# Patient Record
Sex: Male | Born: 1980
Health system: Southern US, Community
[De-identification: ages and names within clinical notes are randomized; demographics above are authoritative.]

## PROBLEM LIST (undated history)

## (undated) DIAGNOSIS — E669 Obesity, unspecified: Secondary | ICD-10-CM

## (undated) DIAGNOSIS — Z8619 Personal history of other infectious and parasitic diseases: Secondary | ICD-10-CM

## (undated) DIAGNOSIS — Z8701 Personal history of pneumonia (recurrent): Secondary | ICD-10-CM

## (undated) DIAGNOSIS — T7840XA Allergy, unspecified, initial encounter: Secondary | ICD-10-CM

## (undated) DIAGNOSIS — F419 Anxiety disorder, unspecified: Secondary | ICD-10-CM

## (undated) DIAGNOSIS — F902 Attention-deficit hyperactivity disorder, combined type: Secondary | ICD-10-CM

## (undated) DIAGNOSIS — E785 Hyperlipidemia, unspecified: Secondary | ICD-10-CM

## (undated) HISTORY — DX: Allergy, unspecified, initial encounter: T78.40XA

## (undated) HISTORY — DX: Personal history of other infectious and parasitic diseases: Z86.19

## (undated) HISTORY — PX: SPINE SURGERY: SHX786

## (undated) HISTORY — DX: Hyperlipidemia, unspecified: E78.5

## (undated) HISTORY — DX: Attention-deficit hyperactivity disorder, combined type: F90.2

## (undated) HISTORY — DX: Obesity, unspecified: E66.9

## (undated) HISTORY — DX: Personal history of pneumonia (recurrent): Z87.01

## (undated) HISTORY — DX: Anxiety disorder, unspecified: F41.9

---
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
## 2004-02-02 ENCOUNTER — Ambulatory Visit: Payer: Self-pay | Admitting: Family Medicine

## 2004-06-07 ENCOUNTER — Ambulatory Visit: Payer: Self-pay | Admitting: Internal Medicine

## 2004-11-10 ENCOUNTER — Ambulatory Visit: Payer: Self-pay | Admitting: Internal Medicine

## 2004-11-11 ENCOUNTER — Encounter: Payer: Self-pay | Admitting: Family Medicine

## 2004-11-11 LAB — CONVERTED CEMR LAB
ALT: 29 U/L
AST: 20 U/L
Albumin: 4.4 g/dL
Alkaline Phosphatase: 95 U/L
BUN: 9 mg/dL
CO2: 29 meq/L
Calcium: 9.3 mg/dL
Chloride: 100 meq/L
Cholesterol: 165 mg/dL
Creatinine, Ser: 1.1 mg/dL
Glucose, Bld: 86 mg/dL
HCT: 45.7 %
HDL: 26.3 mg/dL
Hemoglobin: 15 g/dL
LDL Cholesterol: 121 mg/dL
MCV: 84.1 fL
Platelets: 315 K/uL
Potassium: 3.7 meq/L
RBC: 5.43 M/uL
Sodium: 138 meq/L
TSH: 3.33 u[IU]/mL
Total Bilirubin: 1.6 mg/dL
Total Protein: 7.4 g/dL
Triglycerides: 88 mg/dL
WBC: 9.5 10*3/microliter

## 2006-01-23 ENCOUNTER — Ambulatory Visit: Payer: Self-pay | Admitting: Family Medicine

## 2007-05-29 ENCOUNTER — Ambulatory Visit: Payer: Self-pay | Admitting: Internal Medicine

## 2007-05-30 ENCOUNTER — Telehealth (INDEPENDENT_AMBULATORY_CARE_PROVIDER_SITE_OTHER): Payer: Self-pay | Admitting: *Deleted

## 2009-01-07 ENCOUNTER — Ambulatory Visit: Payer: Self-pay | Admitting: Family Medicine

## 2009-01-07 ENCOUNTER — Encounter (INDEPENDENT_AMBULATORY_CARE_PROVIDER_SITE_OTHER): Payer: Self-pay | Admitting: Internal Medicine

## 2009-11-26 ENCOUNTER — Encounter: Payer: Self-pay | Admitting: Family Medicine

## 2009-11-26 LAB — CONVERTED CEMR LAB
Glucose, Bld: 84 mg/dL
HDL: 17 mg/dL
Triglycerides: 148 mg/dL

## 2010-03-16 ENCOUNTER — Encounter: Payer: Self-pay | Admitting: Family Medicine

## 2010-03-17 ENCOUNTER — Encounter: Payer: Self-pay | Admitting: Family Medicine

## 2010-03-17 ENCOUNTER — Ambulatory Visit
Admission: RE | Admit: 2010-03-17 | Discharge: 2010-03-17 | Payer: Self-pay | Source: Home / Self Care | Attending: Family Medicine | Admitting: Family Medicine

## 2010-03-17 DIAGNOSIS — R002 Palpitations: Secondary | ICD-10-CM | POA: Insufficient documentation

## 2010-03-17 DIAGNOSIS — E66811 Obesity, class 1: Secondary | ICD-10-CM | POA: Insufficient documentation

## 2010-03-17 DIAGNOSIS — E785 Hyperlipidemia, unspecified: Secondary | ICD-10-CM | POA: Insufficient documentation

## 2010-03-17 DIAGNOSIS — E669 Obesity, unspecified: Secondary | ICD-10-CM | POA: Insufficient documentation

## 2010-04-08 NOTE — Assessment & Plan Note (Signed)
Summary: Gerald [name] to get established with Dr. Reece [name].   Vital Signs:  [name] profile:   30 year old male Height:      69 inches Weight:      239.25 pounds BMI:     35.46 Temp:     98.2 degrees F oral Pulse rate:   88 / minute Pulse rhythm:   regular BP sitting:   118 / 70  (left arm) Cuff size:   large  Vitals Entered By: Gerald [name] CMA (AAMA) (March 17, 2010 10:30 AM) CC: Re-establish care   History of Present Illness: CC: re establish  previously seen here, saw Gerald bean and other nurse practitioner  12/2009 - biometric screening told cholesterol not the best.  Thinks TC was under 200.  good cholesterol was very low.  was fasting when checked.    feeling "off" last few days.  feels heart rate racing last few days.  Monday had bad headache with tunnel vision, went away with alleve and water and meal.  No chest pain/tightness with palpitations.  + mild SOB and diaphoresis with this.  No dizziness.  No n/v.  Stress level ok currently.  No skipped beats.  clarified as "heart beating louder than normal"  obesity - BMI 35.  current weight is heaviest he has been.  fast food 3x/wk.  poor vegetable intake, 2-3 fruits a day.  also having time constraints.  exercise - plays with kids.  uses stairs instead of elevator pretty consistently.  drinking coke, 2/day.  preventative flu shot - declines tetanus - unsure blood work at work.  will bring in for [place] to have copy.  Current Medications (verified): 1)  Claritin 10 Mg Tabs (Loratadine) .... As Needed in Fall 2)  Fish Oil 1000 Mg Caps (Omega-3 Fatty Acids) .... Sporadic Daily  Allergies: 1)  ! * Pencillin 2)  ! 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  The [name] denies anorexia, fever, weight loss, weight gain, vision loss, decreased hearing, hoarseness, chest pain, syncope, dyspnea on exertion, peripheral edema, prolonged cough, hemoptysis, abdominal pain, melena, hematochezia, severe indigestion/heartburn, hematuria, depression, and testicular masses.    Physical Exam  General:  Well-developed,well-nourished,in no acute distress; alert,appropriate and cooperative throughout examination Head:  Normocephalic and atraumatic without obvious abnormalities.  Eyes:  No corneal or conjunctival inflammation noted. EOMI. Perrla. Ears:  TMs clear bilaterally Nose:  nares clear Mouth:  MMM, no pharyngeal erythema/edema Neck:  supple and full ROM.  no LAD, no thyromegaly Lungs:  normal respiratory effort, no accessory muscle use, and normal breath sounds.   Heart:  normal rate and regular rhythm.   Abdomen:  Bowel sounds positive,abdomen soft and non-tender without masses, organomegaly or hernias noted. Msk:  No deformity or scoliosis noted of thoracic or lumbar spine.   Pulses:  2+ rad pulses Extremities:  no pedal edema Neurologic:  CN grossly intact, station and gait intact Skin:  Intact without suspicious lesions or rashes Psych:  normally interactive and good eye contact.  full affect   Impression & Recommendations:  Problem # 1:  PALPITATIONS (ICD-785.1) no red flags.  advised titrate down on caffeine.  Stress reduction discussed.  bring copy of blood work from work, will likely want to check TSH and CBC once review blood work.  return if worsening, o/w return 3 mo for f/u.  EKG - NSR @ 70, no acute ST/T changes, normal intervals, axis.  Problem # 2:  DYSLIPIDEMIA (ICD-272.4)  per [name].  told had  low HDL this year.  discussed increased aerobic exercise as well as weight loss  Labs Reviewed: SGOT: 20 (11/11/2004)   SGPT: 29 (11/11/2004)   HDL:26.3 (11/11/2004)  LDL:121 (11/11/2004)  Chol:165 (11/11/2004)  Trig:88 (11/11/2004)  Problem # 3:  OBESITY (ICD-278.00) await blood work, review, update if any extra blood work desired.  return 3 mo for f/u. Ht: 69 (03/17/2010)   Wt: 239.25 (03/17/2010)   BMI: 35.46 (03/17/2010)  Complete Medication List: 1)  Claritin 10 Mg Tabs (Loratadine) .... As needed in fall 2)  Fish Oil 1000 Mg Caps (Omega-3 fatty acids) .... Sporadic daily  Other Orders: Tdap => 34yrs IM (16109) Admin 1st Vaccine (60454)  [name] Instructions: 1)  Bring copy of recent blood work for [place] to have.  I will review and then contact you with further blood work. 2)  Return in 3 months for f/u 3)  Checked EKG today for heart racing - looking normal. 4)  Slowly back away from caffeine. 5)  Keep eye on diet - more fruits and vegetables. 6)  aerobic exercises is best thing to raise good cholesterol (HDL) 7)  Good to meet you today, call clinic with quesitons.   Orders Added: 1)  Tdap => 56yrs IM [90715] 2)  Admin 1st Vaccine [90471]   Immunizations Administered:  Tetanus Vaccine:    Vaccine Type: Tdap    Site: left deltoid    Mfr: GlaxoSmithKline    Dose: 0.5 ml    Route: IM    Given by: Gerald [name] CMA (AAMA)    Exp. Date: 12/25/2011    Lot #: [id number]    VIS given: 01/23/08 version given March 17, 2010.   Immunizations Administered:  Tetanus Vaccine:    Vaccine Type: Tdap    Site: left deltoid    Mfr: GlaxoSmithKline    Dose: 0.5 ml    Route: IM    Given by: Gerald [name] CMA (AAMA)    Exp. Date: 12/25/2011    Lot #: [id number]    VIS given: 01/23/08 version given March 17, 2010.  Prior Medications: Current Allergies (reviewed today): ! * PENCILLIN ! * SHELL FISH   Prevention & Chronic Care Immunizations   Influenza vaccine: Not  documented    Tetanus booster: 03/17/2010: Tdap   Tetanus booster due: 03/17/2020    Pneumococcal vaccine: Not documented  Other Screening   Smoking status: Not documented  Lipids   Total Cholesterol: 165  (11/11/2004)   LDL: 121  (11/11/2004)   LDL Direct: Not documented   HDL: 26.3  (11/11/2004)   Triglycerides: 88  (11/11/2004)    SGOT (AST): 20  (11/11/2004)   SGPT (ALT): 29  (11/11/2004)   Alkaline phosphatase: 95  (11/11/2004)   Total bilirubin: 1.6  (11/11/2004)  Self-Management Support :    Lipid self-management support: Not documented     Appended Document: Orders Update    Clinical Lists Changes  Orders: Added new Service order of Est. [name] Level  IV (16109) - Signed      Appended Document: Gerald [name] to get established with Dr. Reece [name].    Clinical Lists Changes  Orders: Added new Service order of EKG w/ Interpretation (93000) - Signed

## 2010-04-08 NOTE — Miscellaneous (Signed)
Summary: Paper chart update/input  Clinical Lists Changes     Allergies: Changed allergy or adverse reaction from * PENCILLIN to * PENCILLIN  Protein S Panel: Protein S-Functional: 105 (11/11/2004) Protein S, Total: 148 (11/11/2004) Protein S, Free: 115 (11/11/2005) Observations: Added new observation of PAST SURG HX: Adenoidectomy 1984 (03/16/2010 16:03) Added new observation of PAST MED HX: pneumonia 1988 h/o chicken pox (03/16/2010 16:03) Added new observation of ALLERGY REV: Done (03/16/2010 16:03) Added new observation of FAMILY HX: sister - CAD/MI age 32yo, Protein S deficiency, T2DM, asthma (03/16/2010 16:03) Added new observation of PLATELETK/UL: 315 K/uL (11/11/2004 16:03) Added new observation of MCV: 84.1 fL (11/11/2004 16:03) Added new observation of HCT: 45.7 % (11/11/2004 16:03) Added new observation of HGB: 15.0 g/dL (14/78/2956 21:30) Added new observation of RBC M/UL: 5.43 M/uL (11/11/2004 16:03) Added new observation of WBC COUNT: 9.5 10*3/microliter (11/11/2004 16:03) Added new observation of TSH: 3.33 microintl units/mL (11/11/2004 16:03) Added new observation of CALCIUM: 9.3 mg/dL (86/57/8469 62:95) Added new observation of ALBUMIN: 4.4 g/dL (28/41/3244 01:02) Added new observation of PROTEIN, TOT: 7.4 g/dL (72/53/6644 03:47) Added new observation of SGPT (ALT): 29 units/L (11/11/2004 16:03) Added new observation of SGOT (AST): 20 units/L (11/11/2004 16:03) Added new observation of ALK PHOS: 95 units/L (11/11/2004 16:03) Added new observation of BILI TOTAL: 1.6 mg/dL (42/59/5638 75:64) Added new observation of CREATININE: 1.1 mg/dL (33/29/5188 41:66) Added new observation of BUN: 9 mg/dL (09/04/1599 09:32) Added new observation of BG RANDOM: 86 mg/dL (35/57/3220 25:42) Added new observation of CO2 PLSM/SER: 29 meq/L (11/11/2004 16:03) Added new observation of CL SERUM: 100 meq/L (11/11/2004 16:03) Added new observation of K SERUM: 3.7 meq/L (11/11/2004  16:03) Added new observation of NA: 138 meq/L (11/11/2004 16:03) Added new observation of LDL: 121 mg/dL (70/62/3762 83:15) Added new observation of HDL: 26.3 mg/dL (17/61/6073 71:06) Added new observation of TRIGLYC TOT: 88 mg/dL (26/94/8546 27:03) Added new observation of CHOLESTEROL: 165 mg/dL (50/11/3816 29:93) Added new observation of TD BOOSTER: given  (08/19/1993 18:30)       Family History: sister - CAD/MI age 5yo, Protein S deficiency, T2DM, asthma   -  Date:  08/19/1993    TD booster given   Past History:  Past Medical History: pneumonia 23 h/o chicken pox  Past Surgical History: Adenoidectomy 1984   Allergies: 1)  ! * Pencillin 2)  ! * Shell Fish

## 2010-05-08 ENCOUNTER — Encounter: Payer: Self-pay | Admitting: Family Medicine

## 2010-05-12 ENCOUNTER — Encounter: Payer: Self-pay | Admitting: Family Medicine

## 2010-05-24 ENCOUNTER — Ambulatory Visit (INDEPENDENT_AMBULATORY_CARE_PROVIDER_SITE_OTHER): Payer: 59 | Admitting: Internal Medicine

## 2010-05-24 ENCOUNTER — Encounter: Payer: Self-pay | Admitting: Internal Medicine

## 2010-05-24 DIAGNOSIS — J039 Acute tonsillitis, unspecified: Secondary | ICD-10-CM

## 2010-05-24 DIAGNOSIS — J309 Allergic rhinitis, unspecified: Secondary | ICD-10-CM | POA: Insufficient documentation

## 2010-05-25 NOTE — Miscellaneous (Signed)
Summary: health screening results  Clinical Lists Changes  please input into flowsheet.   call pt for rpt blood work fasting - [CBC, TSH, BMP, FLP 278.0, 272.4, 785.1] notify I reviewed blood work - HDL (good cholesterol) too low at 17.  LDL (bad cholesterol) some high at 121, goal less than 100 for him given family history.  do want to check some other blood work (to check on palpitations) Gerald Boyden  MD  May 12, 2010 10:35 PM  Chart updated. Message left for patient to return my call. Kim Dance CMA Duncan Dull)  May 13, 2010 11:43 AM   Spoke with patient's wife. She will have patient call to schedule labs. Kim Dance CMA Duncan Dull)  May 17, 2010 1:08 PM   Spoke with patient. He requests a written script for the labs and he will pick it up and have labs drawn at work [employer]) with results faxed to [place]. Kim Dance CMA Duncan Dull)  May 17, 2010 1:54 PM   done. Gerald Boyden  MD  May 17, 2010 3:31 PM   Order placed up front for patient to pick up. Kim Dance CMA (AAMA)  May 18, 2010 1:15 PM  Observations: Added new observation of BG RANDOM: 84 mg/dL (16/12/9602 54:09) Added new observation of LDL: 121 mg/dL (81/19/1478 29:56) Added new observation of HDL: 17 mg/dL (21/30/8657 84:69) Added new observation of TRIGLYC TOT: 148 mg/dL (62/95/2841 32:44) Added new observation of CHOLESTEROL: 168 mg/dL (03/09/7251 66:44)

## 2010-06-03 NOTE — Letter (Signed)
Summary: history form   history form   Imported By: Eugenio Hoes 05/25/2010 14:59:42  _____________________________________________________________________  External Attachment:    Type:   Image     Comment:   External Document

## 2010-06-03 NOTE — Assessment & Plan Note (Signed)
Summary: left ear hurt and throat hurts   Vital Signs:  Patient Profile:   30 Years Old Male CC:      possible ear infection and sore throat x 4 days Height:     69 inches Weight:      234 pounds Temp:     99.0 degrees F Pulse rate:   88 / minute Resp:     14 per minute BP sitting:   108 / 68  (left arm)  Pt. in pain?   no                   Prior Medication List:  CLARITIN 10 MG TABS (LORATADINE) as needed in fall FISH OIL 1000 MG CAPS (OMEGA-3 FATTY ACIDS) sporadic daily   Current Allergies: ! * PENCILLIN ! * SHELL FISHHistory of Present Illness Chief Complaint: possible ear infection and sore throat x 4 days History of Present Illness: family has been sick and required antibiotics. Patient with sudden onset sore throat. Nasal congestion worse than usual allergies. Left ear started hurting yest. Had chill yest with temp 100.5 and gen'l aches.  REVIEW OF SYSTEMS Constitutional Symptoms       Complains of chills.     Denies fever, night sweats, weight loss, weight gain, and fatigue.  Eyes       Denies change in vision, eye pain, eye discharge, glasses, contact lenses, and eye surgery. Ear/Nose/Throat/Mouth       Complains of ear pain, sinus problems, and sore throat.      Denies hearing loss/aids, change in hearing, ear discharge, dizziness, frequent runny nose, frequent nose bleeds, hoarseness, and tooth pain or bleeding.  Respiratory       Complains of dry cough.      Denies productive cough, wheezing, shortness of breath, asthma, bronchitis, and emphysema/COPD.  Cardiovascular       Denies murmurs, chest pain, and tires easily with exhertion.    Gastrointestinal       Denies stomach pain, nausea/vomiting, diarrhea, constipation, blood in bowel movements, and indigestion. Genitourniary       Denies painful urination, kidney stones, and loss of urinary control. Neurological       Denies paralysis, seizures, and fainting/blackouts. Musculoskeletal       Denies muscle  pain, joint pain, joint stiffness, decreased range of motion, redness, swelling, muscle weakness, and gout.  Skin       Denies bruising, unusual mles/lumps or sores, and hair/skin or nail changes.  Psych       Denies mood changes, temper/anger issues, anxiety/stress, speech problems, depression, and sleep problems.  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 Lives with wife, 2 sons, 1 cat  Past Medical History: pneumonia 1988 obesity h/o chicken pox Allergic rhinitis Physical Exam General appearance: well developed, well nourished, no acute distress Head: normocephalic, atraumatic Eyes: conjunctivae and lids normal Pupils: equal, round, reactive to light Ears: dull, non-mobile TML Nasal: pale and boggy nasal mucosa Oral/Pharynx: bilateral tonsilar enlargement with whited spotty exudates, uvula midline without deviation Neck: supple,anterior lymphadenopathy present Chest/Lungs: no rales, wheezes, or rhonchi  bilateral, breath sounds equal without effort Extremities: normal extremities Neurological: grossly intact and non-focal Skin: no obvious rashe MSE: oriented to time, place, and person Assessment New Problems: ALLERGIC RHINITIS (ICD-477.9) ACUTE TONSILLITIS (ICD-463)   Plan New Medications/Changes: AZITHROMYCIN 250 MG TABS (AZITHROMYCIN) 2 by mouth then 1 by mouth qd  #6 x 0, 05/24/2010, J. Juline Patch MD   The patient and/or caregiver has been counseled thoroughly with regard to medications prescribed including  dosage, schedule, interactions, rationale for use, and possible side effects and they verbalize understanding.  Diagnoses and expected course of recovery discussed and will return if not improved as expected or if the condition worsens. Patient and/or caregiver verbalized understanding.  Prescriptions: AZITHROMYCIN 250 MG TABS (AZITHROMYCIN) 2 by mouth then 1 by mouth qd  #6 x 0   Entered and Authorized by:   J. Juline Patch MD   Signed by:   Shela Commons. Juline Patch MD on 05/24/2010   Method used:   Electronically to        [facility]  [address] [id number] (retail)       [address]       [place], [place]  [address]       Ph: [phone] or [phone]       Fax: [phone]   RxID:   [id number]   Patient Instructions: 1)  Take 650-1000mg  of Tylenol every 4-6 hours as needed for relief of pain or comfort of fever AVOID taking more than 4000mg   in a 24 hour period (can cause liver damage in higher doses). 2)  Take 400-600mg  of Ibuprofen (Advil, Motrin) with food every 4-6 hours as needed for relief of pain or comfort of fever. 3)  gargles, lozenges 4)  claritin-d every 12 hrs. 5)  afrin generic 2 sprays in each nostril every 12 hours for 3 days maximum followed by 3 days of non-use. Continue cycle as needed to control nasal or ear congestion

## 2010-06-22 ENCOUNTER — Ambulatory Visit: Payer: Self-pay | Admitting: Family Medicine

## 2010-07-07 ENCOUNTER — Ambulatory Visit (INDEPENDENT_AMBULATORY_CARE_PROVIDER_SITE_OTHER): Payer: 59 | Admitting: Family Medicine

## 2010-07-07 ENCOUNTER — Encounter: Payer: Self-pay | Admitting: Family Medicine

## 2010-07-07 DIAGNOSIS — E669 Obesity, unspecified: Secondary | ICD-10-CM

## 2010-07-07 DIAGNOSIS — E785 Hyperlipidemia, unspecified: Secondary | ICD-10-CM

## 2010-07-07 LAB — LIPID PANEL
Cholesterol, Total: 158
Triglycerides: 149

## 2010-07-07 LAB — COMPREHENSIVE METABOLIC PANEL
ALT: 41 U/L — AB (ref 10–40)
AST: 21 U/L
Albumin: 4.7
Glucose: 88
Total Bilirubin: 1 mg/dL

## 2010-07-07 NOTE — Assessment & Plan Note (Signed)
Discussed healthy eating, encouraged continued exercise.  Down 6 lbs since last visit.

## 2010-07-07 NOTE — Patient Instructions (Signed)
blood work today to check on sugar as well as cholesterol levels. Good to see you today, call us with questions. Return in 6 months for follow up.

## 2010-07-07 NOTE — Assessment & Plan Note (Addendum)
Sister with h/o CAD/MI age 32yo (in h/o prot S def and PE).  Goal LDL discussed <100.   Pt prefers to try lifestyle changes. Forgot to provide with low chol diet but will ask Selena Batten to mail to him. Check blood work today.

## 2010-07-07 NOTE — Progress Notes (Signed)
  Subjective:    Patient ID: Gerald [name], male    DOB: [birth year], 30 y.o.   MRN: [id number]  HPI CC: f/u blood work and labs  Weight down 6 lbs!  Dietwise - better, less fast food, more fish.  Participating in "biggest loser" program at work, working and playing more outside.  Golf league restarted.  No recent blood work, has been fasting.  Gets blood at [facility].  No problems with palpitations recently.  No myalgias.  Medications and allergies reviewed and updated as above. PMHx reviewed and updated as well as FmHx.  Review of Systems Per HPI    Objective:   Physical Exam  Vitals reviewed. Constitutional: He appears well-developed and well-nourished. No distress.  HENT:  Head: Normocephalic and atraumatic.  Mouth/Throat: Oropharynx is clear and moist. No oropharyngeal exudate.  Eyes: Conjunctivae are normal. Pupils are equal, round, and reactive to light. No scleral icterus.  Neck: Normal range of motion. Neck supple. Carotid bruit is not present.  Cardiovascular: Normal rate, regular rhythm, normal heart sounds and intact distal pulses.   No murmur heard. Pulmonary/Chest: Effort normal and breath sounds normal. No respiratory distress. He has no wheezes. He has no rales.  Abdominal: Soft. Bowel sounds are normal.  Lymphadenopathy:    He has no cervical adenopathy.  Skin: Skin is warm. No rash noted.  Psychiatric: He has a normal mood and affect.          Assessment & Plan:

## 2010-07-08 ENCOUNTER — Encounter: Payer: Self-pay | Admitting: Family Medicine

## 2011-02-15 ENCOUNTER — Ambulatory Visit (INDEPENDENT_AMBULATORY_CARE_PROVIDER_SITE_OTHER): Payer: 59 | Admitting: Family Medicine

## 2011-02-15 ENCOUNTER — Encounter: Payer: Self-pay | Admitting: Family Medicine

## 2011-02-15 VITALS — BP 114/68 | HR 92 | Temp 98.7°F | Wt 225.5 lb

## 2011-02-15 DIAGNOSIS — J40 Bronchitis, not specified as acute or chronic: Secondary | ICD-10-CM

## 2011-02-15 MED ORDER — HYDROCOD POLST-CHLORPHEN POLST 10-8 MG/5ML PO LQCR: 5.0000 mL | Freq: Every evening | ORAL | Status: DC | PRN

## 2011-02-15 NOTE — Progress Notes (Signed)
  Subjective:    Patient ID: Gerald [name], male    DOB: [birth year], 30 y.o.   MRN: [id number]  HPI CC: cough, congestion  4d h/o cough, congestion.  Seems to be getting progressively worse.  This morning awoke with hoarse voice.  + significant sinus drainage, chest congestion.  Mild sinus pressure, no HA.  Sleeping ok.  Tried mucinex fast max but hasn't helped.    No fevers/chills, abd pain, n/v/d, myalgia, arthralgia, body aches.  No chest discomfort.  Mild SOB.  + sick contacts at home.  No smokers at home.  No h/o asthma.  Allergies controlled by claritin.  Review of Systems Per HPI    Objective:   Physical Exam  Nursing note and vitals reviewed. Constitutional: He appears well-developed and well-nourished. No distress.  HENT:  Head: Normocephalic and atraumatic.  Right Ear: Hearing, tympanic membrane, external ear and ear canal normal.  Left Ear: Hearing, tympanic membrane, external ear and ear canal normal.  Nose: Nose normal. No mucosal edema or rhinorrhea. Right sinus exhibits no maxillary sinus tenderness and no frontal sinus tenderness. Left sinus exhibits no maxillary sinus tenderness and no frontal sinus tenderness.  Mouth/Throat: Uvula is midline, oropharynx is clear and moist and mucous membranes are normal. No oropharyngeal exudate, posterior oropharyngeal edema, posterior oropharyngeal erythema or tonsillar abscesses.  Eyes: Conjunctivae and EOM are normal. Pupils are equal, round, and reactive to light. No scleral icterus.  Neck: Normal range of motion. Neck supple.  Cardiovascular: Normal rate, regular rhythm, normal heart sounds and intact distal pulses.   No murmur heard. Pulmonary/Chest: Effort normal and breath sounds normal. No respiratory distress. He has no wheezes. He has no rales.  Lymphadenopathy:    He has no cervical adenopathy.  Skin: Skin is warm and dry. No rash noted.       Assessment & Plan:

## 2011-02-15 NOTE — Assessment & Plan Note (Signed)
Anticipate viral.  Supportive care. No need for abx currently. Update [place] if not improving as expected.

## 2011-02-15 NOTE — Patient Instructions (Signed)
Sounds like you have a viral upper respiratory infection. Antibiotics are not needed for this.  Viral infections usually take 7-10 days to resolve.  The cough can last several weeks to go away. Use medication as prescribed: tussionex for cough at night time. Push fluids and plenty of rest. May continue mucinex with plenty of fluid to mobilize mucous. Please let us know if you are not improving as expected, or if you have high fevers (>101.5) or difficulty swallowing or worsening productive cough. Call clinic with questions.  Good to see you today.

## 2011-04-13 ENCOUNTER — Ambulatory Visit (INDEPENDENT_AMBULATORY_CARE_PROVIDER_SITE_OTHER): Payer: 59 | Admitting: Family Medicine

## 2011-04-13 ENCOUNTER — Encounter: Payer: Self-pay | Admitting: Family Medicine

## 2011-04-13 VITALS — BP 120/74 | HR 84 | Temp 97.7°F | Wt 228.2 lb

## 2011-04-13 DIAGNOSIS — M7700 Medial epicondylitis, unspecified elbow: Secondary | ICD-10-CM

## 2011-04-13 DIAGNOSIS — M7702 Medial epicondylitis, left elbow: Secondary | ICD-10-CM | POA: Insufficient documentation

## 2011-04-13 NOTE — Assessment & Plan Note (Signed)
With mild ulnar neuropathy, as seems to be resolving, treat conservatively with ice/heat, OTC NSAIDs, and stretching exercises from SM pt advisor (provided with both medial epicondylitis and ulnar neuropathy stretching exercises). rec avoid repetitive arm motions for next few weeks. Update me if worsening or not improving after 2 wks.

## 2011-04-13 NOTE — Progress Notes (Signed)
  Subjective:    Patient ID: Gerald [name], male    DOB: [birth year], 31 y.o.   MRN: [id number]  HPI CC: L arm issues  1+ wk h/o numbness, tingling L 4th and 5th fingers, going up ulnar forearm to elbow.  Possible mild weakness - noticed with moving paper.  This happened mostly Tuesday to Thursday of last week.  Last few days improved some.    Purchased eliptical for christmas - has been using more, wonders if due to repetitive hand motions for eliptical.  Started noticing more at work.  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  Ergonomically set up desk.  No fevers/chills, neck pain, shooting pain down arms or shoulder, back pain, chest pain.  Never had anything like this before.  Has tried tylenol arthritis.  Review of Systems Per HPI    Objective:   Physical Exam  Nursing note and vitals reviewed. Constitutional: He is oriented to person, place, and time. He appears well-developed and well-nourished. No distress.  Musculoskeletal:       Right elbow: Normal.      Neck - FROM, no pain midline Shoulders - FROM L elbow - point tender at medial epicondyle. Mildly tender at ulnar styloid.     Neurological: He is alert and oriented to person, place, and time. He has normal strength. No sensory deficit. He exhibits normal muscle tone.       Diminished DTRs throughout       Assessment & Plan:

## 2011-04-13 NOTE — Patient Instructions (Addendum)
I think you have medial epicondylitis which is causing inflammation of your ulnar nerve and that's why you have tingling/numbness at the pinky. Stretching exercises proivded. Use ibuprofen 400mg  2-3 times daily for next few days. Update me if not improving as expected.  Medial Epicondylitis (Golfer's Elbow) with Rehab Medial epicondylitis involves inflammation and pain around the inner (medial) portion of the elbow. This pain is caused by inflammation of the tendons in the forearm that flex (bring down) the wrist. Medial epicondylitis is also called golfer's elbow, because it is common among golfers. However, it may occur in any individual who flexes the wrist regularly. If medial epicondylitis is left untreated, it may become a chronic problem. SYMPTOMS   Pain, tenderness, or inflammation over the inner (medial) side of the elbow.   Pain or weakness with gripping activities.   Pain that increases with wrist twisting motions (using a screwdriver, playing golf, bowling).  CAUSES  Medial epicondylitis is caused by inflammation of the tendons that flex the wrist. Causes of injury may include:  Chronic, repetitive stress and strain to the tendons that run from the wrist and forearm to the elbow.   Sudden strain on the forearm, including wrist snap when serving balls with racquet sports, or throwing a baseball.  RISK INCREASES WITH:  Sports or occupations that require repetitive and/or strenuous forearm and wrist movements (pitching a baseball, golfing, carpentry).   Poor wrist and forearm strength and flexibility.   Failure to warm up properly before activity.   Resuming activity before healing, rehabilitation, and conditioning are complete.  PREVENTION   Warm up and stretch properly before activity.   Maintain physical fitness:   Strength, flexibility, and endurance.   Cardiovascular fitness.   Wear and use properly fitted equipment.   Learn and use proper technique and have a  coach correct improper technique.   Wear a tennis elbow (counterforce) brace.  PROGNOSIS  The course of this condition depends on the degree of the injury. If treated properly, acute cases (symptoms lasting less than 4 weeks) are often resolved in 2 to 6 weeks. Chronic (longer lasting cases) often resolve in 3 to 6 months, but may require physical therapy. RELATED COMPLICATIONS   Frequently recurring symptoms, resulting in a chronic problem. Properly treating the problem the first time decreases frequency of recurrence.   Chronic inflammation, scarring, and partial tendon tear, requiring surgery.   Delayed healing or resolution of symptoms.  TREATMENT  Treatment first involves the use of ice and medicine, to reduce pain and inflammation. Strengthening and stretching exercises may reduce discomfort, if performed regularly. These exercises may be performed at home, if the condition is an acute injury. Chronic cases may require a referral to a physical therapist for evaluation and treatment. Your caregiver may advise a corticosteroid injection to help reduce inflammation. Rarely, surgery is needed. MEDICATION  If pain medicine is needed, nonsteroidal anti-inflammatory medicines (aspirin and ibuprofen), or other minor pain relievers (acetaminophen), are often advised.   Do not take pain medicine for 7 days before surgery.   Prescription pain relievers may be given, if your caregiver thinks they are needed. Use only as directed and only as much as you need.   Corticosteroid injections may be recommended. These injections should be reserved only for the most severe cases, because they can only be given a certain number of times.  HEAT AND COLD  Cold treatment (icing) should be applied for 10 to 15 minutes every 2 to 3 hours for inflammation and  pain, and immediately after activity that aggravates your symptoms. Use ice packs or an ice massage.   Heat treatment may be used before performing  stretching and strengthening activities prescribed by your caregiver, physical therapist, or athletic trainer. Use a heat pack or a warm water soak.  SEEK MEDICAL CARE IF: Symptoms get worse or do not improve in 2 weeks, despite treatment.

## 2012-01-31 ENCOUNTER — Ambulatory Visit (INDEPENDENT_AMBULATORY_CARE_PROVIDER_SITE_OTHER): Payer: 59 | Admitting: Family Medicine

## 2012-01-31 ENCOUNTER — Encounter: Payer: Self-pay | Admitting: Family Medicine

## 2012-01-31 VITALS — BP 140/92 | HR 79 | Temp 98.0°F | Ht 69.0 in | Wt 231.5 lb

## 2012-01-31 DIAGNOSIS — S46219A Strain of muscle, fascia and tendon of other parts of biceps, unspecified arm, initial encounter: Secondary | ICD-10-CM

## 2012-01-31 DIAGNOSIS — S43499A Other sprain of unspecified shoulder joint, initial encounter: Secondary | ICD-10-CM

## 2012-01-31 NOTE — Progress Notes (Signed)
[facility] [address] [place] [address] Phone: [phone] Fax: [phone]  Date:  01/31/2012   Name:  Gerald [name]   DOB:  21-Feb-1981   MRN:  [id number] Gender: male Age: 31 y.o.  PCP:  Eustaquio Boyden, MD  Evaluating MD: Hannah Beat, MD   Chief Complaint: Shoulder Pain   History of Present Illness:  Gerald [name] is a 31 y.o. pleasant patient who presents with the following:  Left shoulder woke up with it, then over the day, it has been getting worse and wworse, but limited in motion and now with considerable pain.  Has been putting a lot of christmas decorations up.  Has had some subluxations in the past. Now all in the collarbone.   The patient noted above presents with shoulder pain that has been ongoing for 2 days.  there is no history of trauma or accident- active over the last day or so and wrestling with his son. The patient denies neck pain or radicular symptoms. + h/o prior subluxation Dramatically reduced ROM  Medications Tried: none   Prior shoulder Injury: subl Prior surgery: No Prior fracture: No  Handedness: R Works at [employer]   Patient Active Problem List  Diagnosis  . DYSLIPIDEMIA  . OBESITY  . ALLERGIC RHINITIS  . Bronchitis  . Medial epicondylitis of left elbow    Past Medical History  Diagnosis Date  . History of pneumonia 1988  . Obesity   . History of chicken pox   . Dyslipidemia     Past Surgical History  Procedure Date  . Adenoidectomy 1984    History  Substance Use Topics  . Smoking status: Former Games developer  . Smokeless tobacco: Never Used     Comment: quit 2001  . Alcohol Use: Yes     Comment: Occassional    Family History  Problem Relation Age of Onset  . Diabetes Sister     Type 2  . Asthma Sister   . Pulmonary embolism Sister   . Liver disease Sister     ESLD  . Protein S deficiency Sister   . Coronary artery disease Sister 16    CAD/MI  . COPD Maternal  Grandmother   . Cancer Maternal Grandfather     Leukemia  . Pulmonary embolism Paternal Grandfather   . Cancer Paternal Grandfather 55    prostate, skin  . Breast cancer Maternal Aunt   . Colon cancer Maternal Aunt   . Emphysema Maternal Aunt     Allergies  Allergen Reactions  . Azithromycin Diarrhea    GI upset  . Penicillins     REACTION: hives    Medication list has been reviewed and updated.  Outpatient Prescriptions Prior to Visit  Medication Sig Dispense Refill  . fish oil-omega-3 fatty acids 1000 MG capsule Take 1 g by mouth daily. Sporadic daily      . loratadine (CLARITIN) 10 MG tablet Take 10 mg by mouth as needed.         Last reviewed on 01/31/2012  3:40 PM by Consuello Masse, CMA  Review of Systems:   GEN: No fevers, chills. Nontoxic. Primarily MSK c/o today. MSK: Detailed in the HPI GI: tolerating PO intake without difficulty Neuro: No numbness, parasthesias, or tingling associated. Otherwise the pertinent positives of the ROS are noted above.    Physical Examination: Filed Vitals:   01/31/12 1521  BP: 140/92  Pulse: 79  Temp: 98 F (36.7 C)  TempSrc: Oral  Height: 5\' 9"  (1.753 m)  Weight: 231 lb 8 oz (105.008 kg)  SpO2: 96%    Body mass index is 34.19 kg/(m^2). Ideal Body Weight: Weight in (lb) to have BMI = 25: 168.9    GEN: WDWN, NAD, Non-toxic, Alert & Oriented x 3 HEENT: Atraumatic, Normocephalic.  Ears and Nose: No external deformity. EXTR: No clubbing/cyanosis/edema NEURO: Normal gait.  PSYCH: Normally interactive. Conversant. Not depressed or anxious appearing.  Calm demeanor.   L Shoulder: full ROM at neck. Spurling's neg. NT along clavicle, mildly tender at the ac joint Markedly tender along bicipital groove. Tender at supraspinatus insertion. Abduction to 80 deg Flexion to 80 deg All str testing 5/5 Biceps flexion markedly tender Speeds markedly + Neg drop test  All other special testing could not be completed due to  pain  Assessment and Plan:  1. Biceps muscle tear    DOI 01/30/2012  Partial biceps tendon tear vs full thickness not seen today. No defect. Cannot exclude supraspinatus tear, but less likely clinically  Partial MOON ROM protocol Recheck 4 weeks  tylenol  Orders Today:  No orders of the defined types were placed in this encounter.    Updated Medication List: (Includes new medications, updates to list, dose adjustments) No orders of the defined types were placed in this encounter.    Medications Discontinued: There are no discontinued medications.   Hannah Beat, MD

## 2012-03-14 ENCOUNTER — Ambulatory Visit: Payer: 59 | Admitting: Family Medicine

## 2012-03-15 ENCOUNTER — Ambulatory Visit: Payer: 59 | Admitting: Family Medicine

## 2012-09-10 ENCOUNTER — Ambulatory Visit (INDEPENDENT_AMBULATORY_CARE_PROVIDER_SITE_OTHER): Payer: 59 | Admitting: Family Medicine

## 2012-09-10 ENCOUNTER — Encounter: Payer: Self-pay | Admitting: Family Medicine

## 2012-09-10 ENCOUNTER — Encounter: Payer: Self-pay | Admitting: *Deleted

## 2012-09-10 VITALS — BP 110/70 | HR 104 | Temp 99.1°F | Wt 233.2 lb

## 2012-09-10 DIAGNOSIS — J039 Acute tonsillitis, unspecified: Secondary | ICD-10-CM

## 2012-09-10 DIAGNOSIS — J020 Streptococcal pharyngitis: Secondary | ICD-10-CM

## 2012-09-10 DIAGNOSIS — J069 Acute upper respiratory infection, unspecified: Secondary | ICD-10-CM

## 2012-09-10 LAB — POCT RAPID STREP A (OFFICE): Rapid Strep A Screen: POSITIVE — AB

## 2012-09-10 MED ORDER — CEFPODOXIME PROXETIL 100 MG PO TABS: 100.0000 mg | ORAL_TABLET | Freq: Two times a day (BID) | ORAL | Status: DC

## 2012-09-10 NOTE — Patient Instructions (Addendum)
Strep test today = positive. You do have acute tonsillitis - treat with antibiotic for 7 days. Push fluids and rest. Continue to alternate tylenol/ibuprofen to control fever. Watch for worsening fever or trouble swallowing or opening /closing mouth. Good to see you today, call us with quesitons.

## 2012-09-10 NOTE — Progress Notes (Signed)
  Subjective:    Patient ID: Gerald [name], male    DOB: Dec 25, 1980, 32 y.o.   MRN: [id number]  HPI CC: L ear pain  Over last 2 days having worsening pain down L ear.  Fever to 102 last night - controlling with tylenol/ibuprofen combination.  + coughing.  PNdrainage present.  + HA and L sided sore throat.    [facility], no [facility]s provided.    No recent swimming.  No tooth pain, abd pain, vomiting, diarrhea, rash, myalgias or arthralgias.  Appetite down.  Wife sick with HFM disease last week.  Son with URI last week as well.  Past Medical History  Diagnosis Date  . History of pneumonia 1988  . Obesity   . History of chicken pox   . Dyslipidemia      Review of Systems Per HPI     Objective:   Physical Exam  Nursing note and vitals reviewed. Constitutional: He appears well-developed and well-nourished. No distress.  HENT:  Head: Normocephalic and atraumatic.  Right Ear: Hearing, tympanic membrane, external ear and ear canal normal.  Left Ear: Hearing, tympanic membrane and ear canal normal. There is tenderness (preauricular tenderness).  Nose: Nose normal. No mucosal edema or rhinorrhea. Right sinus exhibits no maxillary sinus tenderness and no frontal sinus tenderness. Left sinus exhibits no maxillary sinus tenderness and no frontal sinus tenderness.  Mouth/Throat: Uvula is midline and mucous membranes are normal. Posterior oropharyngeal edema and posterior oropharyngeal erythema present. No oropharyngeal exudate or tonsillar abscesses.  Enlarged tonsils, erythematous and some exudates present.  Eyes: Conjunctivae and EOM are normal. Pupils are equal, round, and reactive to light. No scleral icterus.  Neck: Normal range of motion. Neck supple.  Cardiovascular: Normal rate, regular rhythm, normal heart sounds and intact distal pulses.   No murmur heard. Pulmonary/Chest: Effort normal and breath sounds normal. No respiratory distress. He has no wheezes. He has no rales.   Lymphadenopathy:    He has cervical adenopathy (shotty tender L AC LAD).  Skin: Skin is warm and dry. No rash noted.       Assessment & Plan:

## 2012-09-10 NOTE — Assessment & Plan Note (Addendum)
With spiking fever and erythematous, swollen tonsils and exudates - however ear exam WNL. Anticipate acute tonsillitis, will do RST today to r/o strep throat = positive Treat with cefpodoxime - if unavailable at pharmacy, use keflex Supportive care as per instructions. Offered narcotic for ear pain relief- pt declines.

## 2012-09-10 NOTE — Addendum Note (Signed)
Addended by: Eustaquio Boyden on: 09/10/2012 11:01 AM   Modules accepted: Orders

## 2014-05-10 ENCOUNTER — Other Ambulatory Visit: Payer: Self-pay | Admitting: Family Medicine

## 2014-05-10 DIAGNOSIS — E785 Hyperlipidemia, unspecified: Secondary | ICD-10-CM

## 2014-05-10 DIAGNOSIS — E669 Obesity, unspecified: Secondary | ICD-10-CM

## 2014-05-13 ENCOUNTER — Other Ambulatory Visit (INDEPENDENT_AMBULATORY_CARE_PROVIDER_SITE_OTHER): Payer: 59

## 2014-05-13 DIAGNOSIS — E785 Hyperlipidemia, unspecified: Secondary | ICD-10-CM

## 2014-05-14 LAB — COMPREHENSIVE METABOLIC PANEL
ALT: 22 IU/L (ref 0–44)
AST: 12 IU/L (ref 0–40)
Albumin/Globulin Ratio: 1.8 (ref 1.1–2.5)
Albumin: 4.4 g/dL (ref 3.5–5.5)
Alkaline Phosphatase: 81 IU/L (ref 39–117)
BUN/Creatinine Ratio: 14 (ref 8–19)
BUN: 14 mg/dL (ref 6–20)
Bilirubin Total: 0.8 mg/dL (ref 0.0–1.2)
CALCIUM: 9.3 mg/dL (ref 8.7–10.2)
CHLORIDE: 99 mmol/L (ref 97–108)
CO2: 25 mmol/L (ref 18–29)
CREATININE: 1 mg/dL (ref 0.76–1.27)
GFR calc Af Amer: 112 mL/min/{1.73_m2} (ref >59)
GFR calc non Af Amer: 97 mL/min/{1.73_m2} (ref >59)
Globulin, Total: 2.5 g/dL (ref 1.5–4.5)
Glucose: 102 mg/dL — ABNORMAL HIGH (ref 65–99)
Potassium: 4.4 mmol/L (ref 3.5–5.2)
Sodium: 140 mmol/L (ref 134–144)
Total Protein: 6.9 g/dL (ref 6.0–8.5)

## 2014-05-14 LAB — LIPID PANEL
Chol/HDL Ratio: 3.5 ratio units (ref 0.0–5.0)
Cholesterol, Total: 159 mg/dL (ref 100–199)
HDL: 45 mg/dL (ref >39)
LDL Calculated: 99 mg/dL (ref 0–99)
Triglycerides: 75 mg/dL (ref 0–149)
VLDL CHOLESTEROL CAL: 15 mg/dL (ref 5–40)

## 2014-05-14 LAB — TSH: TSH: 3.94 u[IU]/mL (ref 0.450–4.500)

## 2014-05-20 ENCOUNTER — Encounter: Payer: Self-pay | Admitting: Family Medicine

## 2014-05-20 ENCOUNTER — Encounter (INDEPENDENT_AMBULATORY_CARE_PROVIDER_SITE_OTHER): Payer: Self-pay

## 2014-05-20 ENCOUNTER — Ambulatory Visit (INDEPENDENT_AMBULATORY_CARE_PROVIDER_SITE_OTHER): Payer: 59 | Admitting: Family Medicine

## 2014-05-20 VITALS — BP 118/82 | HR 64 | Temp 97.6°F | Ht 68.25 in | Wt 233.0 lb

## 2014-05-20 DIAGNOSIS — E669 Obesity, unspecified: Secondary | ICD-10-CM

## 2014-05-20 DIAGNOSIS — E785 Hyperlipidemia, unspecified: Secondary | ICD-10-CM

## 2014-05-20 DIAGNOSIS — Z0000 Encounter for general adult medical examination without abnormal findings: Secondary | ICD-10-CM | POA: Insufficient documentation

## 2014-05-20 NOTE — Progress Notes (Signed)
Pre visit review using our clinic review tool, if applicable. No additional management support is needed unless otherwise documented below in the visit note. 

## 2014-05-20 NOTE — Progress Notes (Signed)
BP 118/82 mmHg  Pulse 64  Temp(Src) 97.6 F (36.4 C)  Ht 5' 8.25" (1.734 m)  Wt 233 lb 0.2 oz (105.693 kg)  BMI 35.15 kg/m2   CC: CPE  Subjective:    Patient ID: Gerald [name], male    DOB: 04/22/1980, 34 y.o.   MRN: [id number]  HPI: Gerald [name] is a 34 y.o. male presenting on 05/20/2014 for Annual Exam   Preventative: flu shot - declines  tetanus - 03/2010  Seat belt use discussed.  Sunscreen use discussed. No changing moles.   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 Interim medical history since our last visit reviewed.  Allergies and medications reviewed and updated. Current Outpatient Prescriptions on File Prior to Visit  Medication Sig  . acetaminophen (TYLENOL) 500 MG tablet Take 500 mg by mouth every 6 (six) hours as needed for pain.  . Ibuprofen (ADVIL) 200 MG CAPS Take by mouth as needed.  . loratadine (CLARITIN) 10 MG tablet Take 10 mg by mouth as needed.    . pseudoephedrine (SUDAFED) 30 MG tablet Take 30 mg by mouth every 4 (four) hours as needed for congestion.  . cefpodoxime (VANTIN) 100 MG tablet Take 1 tablet (100 mg total) by mouth 2 (two) times daily. (Patient not taking: Reported on 05/20/2014)  . fish oil-omega-3 fatty acids 1000 MG capsule Take 1 g by mouth daily. Sporadic daily   No current facility-administered medications on file prior to visit.    Review of Systems  Constitutional: Negative for fever, chills, activity change, appetite change, fatigue and unexpected weight change.  HENT: Negative for hearing loss.   Eyes: Negative for visual disturbance.  Respiratory: Negative for cough, chest tightness, shortness of breath and wheezing.   Cardiovascular: Negative for  chest pain, palpitations and leg swelling.  Gastrointestinal: Negative for nausea, vomiting, abdominal pain, diarrhea, constipation, blood in stool and abdominal distention.  Genitourinary: Negative for hematuria and difficulty urinating.  Musculoskeletal: Negative for myalgias, arthralgias and neck pain.  Skin: Negative for rash.  Neurological: Negative for dizziness, seizures, syncope and headaches.  Hematological: Negative for adenopathy. Does not bruise/bleed easily.  Psychiatric/Behavioral: Negative for dysphoric mood. The patient is not nervous/anxious.    Per HPI unless specifically indicated above     Objective:    BP 118/82 mmHg  Pulse 64  Temp(Src) 97.6 F (36.4 C)  Ht 5' 8.25" (1.734 m)  Wt 233 lb 0.2 oz (105.693 kg)  BMI 35.15 kg/m2  Wt Readings from Last 3 Encounters:  05/20/14 233 lb 0.2 oz (105.693 kg)  09/10/12 233 lb 4 oz (105.802 kg)  01/31/12 231 lb 8 oz (105.008 kg)    Physical Exam  Constitutional: He is oriented to person, place, and time. He appears well-developed and well-nourished. No distress.  HENT:  Head: Normocephalic and atraumatic.  Right Ear: Hearing, tympanic membrane, external ear and ear canal normal.  Left Ear: Hearing, tympanic membrane, external ear and ear canal normal.  Nose: Nose normal.  Mouth/Throat: Uvula is midline, oropharynx is clear and moist and mucous membranes are normal. No oropharyngeal exudate, posterior oropharyngeal edema or posterior oropharyngeal erythema.  Eyes: Conjunctivae and EOM are normal. Pupils are equal, round, and reactive to light. No scleral icterus.  Neck: Normal range of motion. Neck supple. No thyromegaly present.  Cardiovascular: Normal rate, regular rhythm, normal heart sounds and intact distal pulses.   No murmur heard. Pulses:      Radial pulses are 2+ on the right side, and 2+ on the left side.  Pulmonary/Chest: Effort normal and breath sounds normal. No respiratory distress. He has no wheezes. He  has no rales.  Abdominal: Soft. Bowel sounds are normal. He exhibits no distension and no mass. There is no tenderness. There is no rebound and no guarding.  Musculoskeletal: Normal range of motion. He exhibits no edema.  Lymphadenopathy:    He has no cervical adenopathy.  Neurological: He is alert and oriented to person, place, and time.  CN grossly intact, station and gait intact  Skin: Skin is warm and dry. No rash noted.  Psychiatric: He has a normal mood and affect. His behavior is normal. Judgment and thought content normal.  Nursing note and vitals reviewed.  Results for orders placed or performed in visit on 05/13/14  Comprehensive metabolic panel  Result Value Ref Range   Glucose 102 (H) 65 - 99 mg/dL   BUN 14 6 - 20 mg/dL   Creatinine, Ser 1.44 0.76 - 1.27 mg/dL   GFR calc non Af Amer 97 >59 mL/min/1.73   GFR calc Af Amer 112 >59 mL/min/1.73   BUN/Creatinine Ratio 14 8 - 19   Sodium 140 134 - 144 mmol/L   Potassium 4.4 3.5 - 5.2 mmol/L   Chloride 99 97 - 108 mmol/L   CO2 25 18 - 29 mmol/L   Calcium 9.3 8.7 - 10.2 mg/dL   Total Protein 6.9 6.0 - 8.5 g/dL   Albumin 4.4 3.5 - 5.5 g/dL   Globulin, Total 2.5 1.5 - 4.5 g/dL   Albumin/Globulin Ratio 1.8 1.1 - 2.5   Bilirubin Total 0.8 0.0 - 1.2 mg/dL   Alkaline Phosphatase 81 39 - 117 IU/L   AST 12 0 - 40 IU/L   ALT 22 0 - 44 IU/L  Lipid panel  Result Value Ref Range   Cholesterol, Total 159 100 - 199 mg/dL   Triglycerides 75 0 - 149 mg/dL   HDL 45 >31 mg/dL   VLDL Cholesterol Cal 15 5 - 40 mg/dL   LDL Calculated 99 0 - 99 mg/dL   Chol/HDL Ratio 3.5 0.0 - 5.0 ratio units  TSH  Result Value Ref Range   TSH 3.940 0.450 - 4.500 uIU/mL      Assessment & Plan:   Problem List Items Addressed This Visit    Obesity    Discussed healthy diet and lifestyle change to affect sustainable weight loss.      Health maintenance examination - Primary    Preventative protocols reviewed and updated unless pt declined. Discussed  healthy diet and lifestyle.       Dyslipidemia    Reviewed FLP with patient - overall stable with LDL 99. However he does have fmhx premature CAD (sister age 78yo in h/o Prot S def and PE). No changes currently indicated.          Follow up plan: Return in about 1 year (around 05/20/2015), or as needed, for annual exam, prior fasting for blood work.

## 2014-05-20 NOTE — Assessment & Plan Note (Signed)
Preventative protocols reviewed and updated unless pt declined. Discussed healthy diet and lifestyle.  

## 2014-05-20 NOTE — Assessment & Plan Note (Signed)
Discussed healthy diet and lifestyle change to affect sustainable weight loss.  

## 2014-05-20 NOTE — Patient Instructions (Signed)
You are doing very well! Congratulations on healthy diet and lifestyle changes! labwork reflects healthier lifestyle. Return as needed or in 2 yrs for next physical.

## 2014-05-20 NOTE — Assessment & Plan Note (Signed)
Reviewed FLP with patient - overall stable with LDL 99. However he does have fmhx premature CAD (sister age 36yo in h/o Prot S def and PE). No changes currently indicated.

## 2015-05-22 ENCOUNTER — Encounter: Payer: Self-pay | Admitting: Primary Care

## 2015-05-22 ENCOUNTER — Ambulatory Visit (INDEPENDENT_AMBULATORY_CARE_PROVIDER_SITE_OTHER): Payer: 59 | Admitting: Primary Care

## 2015-05-22 VITALS — BP 122/80 | HR 92 | Temp 97.9°F | Ht 68.25 in | Wt 211.4 lb

## 2015-05-22 DIAGNOSIS — H938X1 Other specified disorders of right ear: Secondary | ICD-10-CM | POA: Diagnosis not present

## 2015-05-22 MED ORDER — PREDNISONE 10 MG PO TABS: ORAL_TABLET | ORAL | Status: DC

## 2015-05-22 MED ORDER — FLUTICASONE PROPIONATE 50 MCG/ACT NA SUSP: 2.0000 | Freq: Every day | NASAL | Status: DC

## 2015-05-22 NOTE — Progress Notes (Signed)
Pre visit review using our clinic review tool, if applicable. No additional management support is needed unless otherwise documented below in the visit note. 

## 2015-05-22 NOTE — Patient Instructions (Signed)
Start with Flonase nasal spray for ear fullness. Instill 2 sprays in each nostril once daily.  If no improvement in 3 days with Flonase, then start the prednisone. Take 3 tablets for 2 days, then 2 tablets for 2 days, then 1 tablet for 2 days.  Continue Claritin tablets.   Please notify us if no improvement with both the Flonase and Prednisone as we may need to send you to Ear, Nose, Throat.  It was a pleasure meeting you!

## 2015-05-22 NOTE — Progress Notes (Signed)
Subjective:    Patient ID: Gerald [name], male    DOB: 01/14/1981, 35 y.o.   MRN: [id number]  HPI  Gerald [name] is a 35 year old male who presents today with a chief complaint of ear fullness. His fullness is present to the right ear and has been present for 6 weeks. He's been taking sudafed, mucinex, and claritin without improvement.  He was evaluated at urgent care about 1 month ago for an otitis media and was told he had fluid to his ears. He does have pain to his right ear that is intermittent. Denies fevers, sore throat, cough.   Review of Systems  Constitutional: Negative for fever and chills.  HENT: Positive for ear pain. Negative for congestion, sinus pressure and sore throat.   Respiratory: Negative for shortness of breath.   Musculoskeletal: Negative for myalgias.       Past Medical History  Diagnosis Date  . History of pneumonia 1988, 2014    walking PNA 08/2012  . Obesity   . History of chicken pox   . Dyslipidemia     Social History   Social History  . Marital Status: Married    Spouse Name: N/A  . Number of Children: 2  . Years of Education: N/A   Occupational History  .  [employer]    corporate division   Social History Main Topics  . Smoking status: Former Games developermoker  . Smokeless tobacco: Never Used     Comment: quit 2001  . Alcohol Use: Yes     Comment: Occassional  . Drug Use: No  . Sexual Activity: Not on file   Other Topics Concern  . 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 Adenoidectomy  1984    Family History  Problem Relation Age of Onset  . Diabetes Sister     Type 2  . Asthma Sister   . Pulmonary embolism Sister   . Liver disease Sister     ESLD  .  Protein S deficiency Sister   . Coronary artery disease Sister 3030    CAD/MI  . COPD Maternal Grandmother   . Cancer Maternal Grandfather     Leukemia  . Pulmonary embolism Paternal Grandfather   . Cancer Paternal Grandfather 5068    prostate, skin  . Cancer Maternal Aunt     breast (40s), colon (50s), smoker  . Emphysema Maternal Aunt     Allergies  Allergen Reactions  . Azithromycin Diarrhea    GI upset  . Penicillins     REACTION: hives    Current Outpatient Prescriptions on File Prior to Visit  Medication Sig Dispense Refill  . acetaminophen (TYLENOL) 500 MG tablet Take 500 mg by mouth every 6 (six) hours as needed for pain.    . fish oil-omega-3 fatty acids 1000 MG capsule Take 1 g by mouth daily. Sporadic daily    . Ibuprofen (ADVIL) 200 MG CAPS Take by mouth as needed.    . loratadine (CLARITIN) 10 MG tablet Take 10 mg by mouth as needed.      . Multiple Vitamin (MULTIVITAMIN) tablet Take 1 tablet by mouth daily.    .Marland Kitchen  pseudoephedrine (SUDAFED) 30 MG tablet Take 30 mg by mouth every 4 (four) hours as needed for congestion.     No current facility-administered medications on file prior to visit.    BP 122/80 mmHg  Pulse 92  Temp(Src) 97.9 F (36.6 C) (Oral)  Ht 5' 8.25" (1.734 m)  Wt 211 lb 6.4 oz (95.89 kg)  BMI 31.89 kg/m2  SpO2 97%    Objective:   Physical Exam  Constitutional: He appears well-nourished.  HENT:  Right Ear: Tympanic membrane and ear canal normal.  Left Ear: Tympanic membrane and ear canal normal.  Nose: Right sinus exhibits no maxillary sinus tenderness and no frontal sinus tenderness. Left sinus exhibits no maxillary sinus tenderness and no frontal sinus tenderness.  Mouth/Throat: Oropharynx is clear and moist.  Neck: Neck supple.  Cardiovascular: Normal rate and regular rhythm.   Pulmonary/Chest: Effort normal and breath sounds normal.  Skin: Skin is warm and dry.          Assessment & Plan:  Ear Fullness:   Located to right ear  x 6 weeks.  No improvement with Claritin, decongestants. Has not tried Flonase. Exam without visual evidence of ear effusion, erythema, or bulging.  Will have him try flonase for several days. If no improvement he is to start low dose prednisone taper. Continue Claritin. If no improvement on Flonase or prednisone, will send to ENT for evaluation. Return precautions provided.

## 2015-06-04 ENCOUNTER — Telehealth: Payer: Self-pay | Admitting: *Deleted

## 2015-06-04 DIAGNOSIS — H938X1 Other specified disorders of right ear: Secondary | ICD-10-CM

## 2015-06-04 NOTE — Telephone Encounter (Signed)
Pt left voicemail at Triage. Pt was seen by Mayra ReelKate Clark, NP on 05/22/15, pt has been using the nasal spray and finished the prednisone Jae DireKate advise him to do, but his right ear is still building up fluid and it's bothering him, pt request referral to ENT due to the ear issue he has had for over 2 months, pt said that him an Jae DireKate discussed that ENT would be the next step if the prednisone and nasal spray didn't help

## 2015-06-04 NOTE — Telephone Encounter (Signed)
Noted. Given persistent symptoms despite conservative and RX treatment will send to ENT. Please notify patient that he referral was made and that our coordinators will be in touch soon.

## 2015-06-04 NOTE — Telephone Encounter (Signed)
Per DPR under FYI, left detail message at home phone for patient.

## 2015-06-12 ENCOUNTER — Encounter: Payer: Self-pay | Admitting: Internal Medicine

## 2015-06-12 ENCOUNTER — Ambulatory Visit (INDEPENDENT_AMBULATORY_CARE_PROVIDER_SITE_OTHER): Payer: 59 | Admitting: Internal Medicine

## 2015-06-12 VITALS — BP 122/80 | HR 113 | Temp 99.4°F | Resp 12 | Wt 210.0 lb

## 2015-06-12 DIAGNOSIS — J0300 Acute streptococcal tonsillitis, unspecified: Secondary | ICD-10-CM

## 2015-06-12 DIAGNOSIS — J039 Acute tonsillitis, unspecified: Secondary | ICD-10-CM | POA: Insufficient documentation

## 2015-06-12 MED ORDER — AZITHROMYCIN 250 MG PO TABS: ORAL_TABLET | ORAL | Status: DC

## 2015-06-12 NOTE — Progress Notes (Signed)
Subjective:    Patient ID: Gerald [name], male    DOB: 11/13/1980, 35 y.o.   MRN: [id number]  HPI Here due to respiratory illness  Having fever and severe sore throat Son with strep and mother yesterday Feels it may be the same---- exudates seen  Started as slight cough 2 days ago---then quickly worsened yesterday Trouble even getting up today Some chills No SOB Has feeling of fluid in his ears---but benign ENT exam on Monday Felt pop the other day  Using his allergy meds Also mucinex and decongestant  Current Outpatient Prescriptions on File Prior to Visit  Medication Sig Dispense Refill  . acetaminophen (TYLENOL) 500 MG tablet Take 500 mg by mouth every 6 (six) hours as needed for pain.    . fish oil-omega-3 fatty acids 1000 MG capsule Take 1 g by mouth daily. Sporadic daily    . fluticasone (FLONASE) 50 MCG/ACT nasal spray Place 2 sprays into both nostrils daily. 16 g 0  . Ibuprofen (ADVIL) 200 MG CAPS Take by mouth as needed.    . loratadine (CLARITIN) 10 MG tablet Take 10 mg by mouth as needed.      . Multiple Vitamin (MULTIVITAMIN) tablet Take 1 tablet by mouth daily.    . pseudoephedrine (SUDAFED) 30 MG tablet Take 30 mg by mouth every 4 (four) hours as needed for congestion.     No current facility-administered medications on file prior to visit.    Allergies  Allergen Reactions  . Azithromycin Diarrhea    GI upset  . Penicillins     REACTION: hives    Past Medical History  Diagnosis Date  . History of pneumonia 1988, 2014    walking PNA 08/2012  . Obesity   . History of chicken pox   . Dyslipidemia     Past Surgical History  Procedure Laterality Date  . Adenoidectomy  1984    Family History  Problem Relation Age of Onset  . Diabetes Sister     Type 2  . Asthma Sister   . Pulmonary embolism Sister   . Liver disease Sister     ESLD  . Protein S deficiency Sister   . Coronary artery disease Sister 830    CAD/MI  . COPD Maternal Grandmother     . Cancer Maternal Grandfather     Leukemia  . Pulmonary embolism Paternal Grandfather   . Cancer Paternal Grandfather [id number]    prostate, skin  . Cancer Maternal Aunt     breast (40s), colon (50s), smoker  . Emphysema Maternal Aunt     Social History   Social History  . Marital Status: Married    Spouse Name: N/A  . Number of Children: 2  . Years of Education: N/A   Occupational History  .  [employer]    corporate division   Social History Main Topics  . Smoking status: Former Games developermoker  . Smokeless tobacco: Never Used     Comment: quit 2001  . Alcohol Use: Yes     Comment: Occassional  . Drug Use: No  . Sexual Activity: Not on file   Other Topics Concern  . Not on file   Social History Narrative   Caffeine: some diet drinks   Lives with wife, 2 sons, 1 cat    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    Activity: golf, walking/running several times a week, wants to run 10k    Diet: water or diet drinks, good vegetables, avoids carbs   Review  of Systems No rash No vomiting or diarrhea Appetite is off    Objective:   Physical Exam  Constitutional: No distress.  Looks washed out  HENT:  No sinus tenderness TMs normal Mild nasal inflammation 2+ tonsils, red with exudates  Neck: Normal range of motion. Neck supple.  Mildly tender bilat anterior cervical nodes  Pulmonary/Chest: Effort normal and breath sounds normal. No respiratory distress. He has no wheezes. He has no rales.  Skin: No rash noted.          Assessment & Plan:

## 2015-06-12 NOTE — Assessment & Plan Note (Signed)
Picture consistent with strep and had direct household exposure Will treat without testing Reviewed allergy history--does okay with z-pak but not biaxin Hives with PCN Analgesics z-pak

## 2015-06-12 NOTE — Progress Notes (Signed)
Pre visit review using our clinic review tool, if applicable. No additional management support is needed unless otherwise documented below in the visit note. 

## 2016-07-14 ENCOUNTER — Ambulatory Visit (INDEPENDENT_AMBULATORY_CARE_PROVIDER_SITE_OTHER): Payer: 59 | Admitting: Family Medicine

## 2016-07-14 ENCOUNTER — Encounter: Payer: Self-pay | Admitting: Family Medicine

## 2016-07-14 VITALS — BP 118/78 | HR 87 | Temp 98.2°F | Ht 68.0 in | Wt 224.0 lb

## 2016-07-14 DIAGNOSIS — M5412 Radiculopathy, cervical region: Secondary | ICD-10-CM

## 2016-07-14 MED ORDER — CYCLOBENZAPRINE HCL 10 MG PO TABS: 5.0000 mg | ORAL_TABLET | Freq: Two times a day (BID) | ORAL | 0 refills | Status: DC | PRN

## 2016-07-14 MED ORDER — PREDNISONE 20 MG PO TABS: ORAL_TABLET | ORAL | 0 refills | Status: DC

## 2016-07-14 NOTE — Patient Instructions (Addendum)
I am suspicious for pinching of a nerve as it comes out of cervical spine. Treat with prednisone taper and muscle relaxant and stretching exercises.  Let us know effect.  If not improving we will likely have you return for xrays.

## 2016-07-14 NOTE — Progress Notes (Signed)
BP 118/78   Pulse 87   Temp 98.2 F (36.8 C) (Oral)   Ht 5\' 8"  (1.727 m)   Wt 224 lb (101.6 kg)   SpO2 97%   BMI 34.06 kg/m    CC: neck pain Subjective:    Patient ID: Gerald [name], male    DOB: 09/19/1980, 36 y.o.   MRN: [id number]  HPI: Gerald Pitandrew M Nafziger is a 36 y.o. male presenting on 07/14/2016 for Neck Pain (radiates down right arm at times, tingling in fingers x 1 week)   Last Tuesday night woke up with R sided neck pain. Normally this improves however currently it's persisting and may be worsening. Now noticing radiation of pain down R posterior shoulder and arm, as well as some numbness and paresthesias of last 2 digits of R hand.   Denies inciting trauma/injury.  Denies fevers/chills.  Treating with heating pad, ice, ibuprofen 600mg  - no significant improvement.   Relevant past medical, surgical, family and social history reviewed and updated as indicated. Interim medical history since our last visit reviewed. Allergies and medications reviewed and updated. Outpatient Medications Prior to Visit  Medication Sig Dispense Refill  . acetaminophen (TYLENOL) 500 MG tablet Take 500 mg by mouth every 6 (six) hours as needed for pain.    . fluticasone (FLONASE) 50 MCG/ACT nasal spray Place 2 sprays into both nostrils daily. 16 g 0  . Ibuprofen (ADVIL) 200 MG CAPS Take by mouth as needed.    . loratadine (CLARITIN) 10 MG tablet Take 10 mg by mouth as needed.      . Multiple Vitamin (MULTIVITAMIN) tablet Take 1 tablet by mouth daily.    . pseudoephedrine (SUDAFED) 30 MG tablet Take 30 mg by mouth every 4 (four) hours as needed for congestion.    Marland Kitchen. azithromycin (ZITHROMAX Z-PAK) 250 MG tablet Take 2 tablets (500 mg) on  Day 1,  followed by 1 tablet (250 mg) once daily on Days 2 through 5. 6 each 0  . fish oil-omega-3 fatty acids 1000 MG capsule Take 1 g by mouth daily. Sporadic daily     No facility-administered medications prior to visit.      Per HPI unless specifically  indicated in ROS section below Review of Systems     Objective:    BP 118/78   Pulse 87   Temp 98.2 F (36.8 C) (Oral)   Ht 5\' 8"  (1.727 m)   Wt 224 lb (101.6 kg)   SpO2 97%   BMI 34.06 kg/m   Wt Readings from Last 3 Encounters:  07/14/16 224 lb (101.6 kg)  06/12/15 210 lb (95.3 kg)  05/22/15 211 lb 6.4 oz (95.9 kg)    Physical Exam  Constitutional: He appears well-developed and well-nourished. No distress.  Musculoskeletal: He exhibits no edema.  Tender to palpation midline mid cervical spine and L paracervical mm No pain at trapezius or shoulders FROM at neck with discomfort at end movements  Neurological: He has normal strength. A sensory deficit (endorsed numbness and decreased monofilament to palmar 4th/5th digits of R hand) is present.  Reflex Scores:      Bicep reflexes are 1+ on the right side and 1+ on the left side. 5/5 strength BUE Grip strength intact  Skin: Skin is warm and dry. No rash noted. No erythema.  Psychiatric: He has a normal mood and affect.  Nursing note and vitals reviewed.     Assessment & Plan:   Problem List Items Addressed This Visit  Right cervical radiculopathy - Primary    Anticipate pinched nerve from bulging disc or possible osteoarthritis. No red flags of weakness of arms. Will treat conservatively with flexeril muscle relaxant, prednisone course. If no better, pt will return for imaging and consider PT. Reviewed red flags to seek urgent care. Pt agrees with plan.       Relevant Medications   cyclobenzaprine (FLEXERIL) 10 MG tablet       Follow up plan: Return if symptoms worsen or fail to improve.  Eustaquio Boyden, MD

## 2016-07-14 NOTE — Assessment & Plan Note (Addendum)
Anticipate pinched nerve from bulging disc or possible osteoarthritis. No red flags of weakness of arms. Will treat conservatively with flexeril muscle relaxant, prednisone course. If no better, pt will return for imaging and consider PT. Reviewed red flags to seek urgent care. Pt agrees with plan.

## 2017-04-27 ENCOUNTER — Telehealth: Payer: Self-pay | Admitting: Family Medicine

## 2017-04-27 MED ORDER — OSELTAMIVIR PHOSPHATE 75 MG PO CAPS: 75.0000 mg | ORAL_CAPSULE | Freq: Two times a day (BID) | ORAL | 0 refills | Status: DC

## 2017-04-27 NOTE — Telephone Encounter (Signed)
Copied from CRM [id number]. Topic: Quick Communication - See Telephone Encounter >> Apr 27, 2017  4:12 PM Rudi CocoLathan, Braydyn Schultes M, [place] wrote: CRM for notification. See Telephone encounter for:   04/27/17. Pt. Wife Chantel calling to see if pt. Can also get tami flu just called and said that he is running a fever and having chills. Wife was in office earlier with child who does have the flu, and the other child was also given tami flu for precaution measures. Wife wants to know if he needs to be seen or can he just get a rx. For tamiflu also.   [id number] [address] [place], [place] [facility] [address] [facility] [address] [place] [id number] Phone: [phone] Fax: [phone]

## 2017-04-27 NOTE — Telephone Encounter (Signed)
I would start tamiflu.  Rx sent.  Please give patient routine ER cautions.  Update us if not better or if med not tolerated.  Thanks.

## 2017-04-27 NOTE — Telephone Encounter (Signed)
Also routed to PCP as FYI.  

## 2017-04-27 NOTE — Telephone Encounter (Signed)
Dr Reece AgarG out of office;Please advise.

## 2017-04-27 NOTE — Telephone Encounter (Signed)
Agree. Thanks

## 2017-04-27 NOTE — Telephone Encounter (Signed)
Wife advised. 

## 2017-05-19 ENCOUNTER — Encounter: Payer: Self-pay | Admitting: Family Medicine

## 2017-05-19 ENCOUNTER — Ambulatory Visit: Payer: BLUE CROSS/BLUE SHIELD | Admitting: Family Medicine

## 2017-05-19 VITALS — BP 120/80 | HR 90 | Temp 98.2°F | Wt 219.0 lb

## 2017-05-19 DIAGNOSIS — F900 Attention-deficit hyperactivity disorder, predominantly inattentive type: Secondary | ICD-10-CM | POA: Insufficient documentation

## 2017-05-19 MED ORDER — ATOMOXETINE HCL 40 MG PO CAPS: 40.0000 mg | ORAL_CAPSULE | Freq: Every day | ORAL | 3 refills | Status: DC

## 2017-05-19 NOTE — Assessment & Plan Note (Addendum)
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 Reviewed this with patient along with work strategies for success ie minimizing external stimuli - this is difficult at his current job.  Discussed stimulant vs non stimulant treatment. Will start with non stimulant atomoxetine 40mg  daily with discussion on mechanism of action and common side effects to watch for.  If ineffective, unaffordable or intolerable, will likely transition to stimulants - discussed I would need to have childhood records to review prior to treating with stimulant.  RTC 3-4 wks f/u visit.  Pt agrees with plan.

## 2017-05-19 NOTE — Progress Notes (Signed)
BP 120/80 (BP Location: Left Arm, Patient Position: Sitting, Cuff Size: Normal)   Pulse 90   Temp 98.2 F (36.8 C) (Oral)   Wt 219 lb (99.3 kg)   SpO2 99%   BMI 33.30 kg/m    CC: ADHD discussion Subjective:    Patient ID: Gerald [name], male    DOB: 07/11/1980, 37 y.o.   MRN: [id number]  HPI: Gerald [name] is a 37 y.o. male presenting on 05/19/2017 for ADHD (Here for f/u.)   Would like to discuss ADHD.   Childhood dx ADHD diagnosed 4th grade - by psychologist - treated with ritalin and desoxin from age [age]. He self stopped ritalin - did not like how he felt on ritalin. Children have same diagnosis.   Ongoing symptoms of ADHD since childhood, but more recently (over last 6-12 months) noticing increased impulsive behavior, trouble completing tasks due to lack of concentration, trouble with attention to details. 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 Also notices forgetfulness at home. He is a Production designer, theatre/television/filmmanager at [employer]  Pt endorses persistent pattern of inattention and or hyperactivity/impulsivity that interferes with daily functioning. Symptoms present in 2 or more settings. Symptoms present before 39 years of age? yes Inattention (6+, 6 months): Careless mistakes? YES Difficulty sustaining attention? YES Doesn't listen when spoken to directly? SOMETIMES Lacks follow through with instructions or difficulty completing tasks? YES Difficulty organizing tasks/activities? YES Avoiding tasks that require sustained attention? NO Easily losing things needed for tasks? YES Easily distracted by external stimuli? YES Forgetful in daily activities? YES Hyperactive/impulsive (6+, 6 months): Fidgeting, tapping hands/feet? NO Leaves seat when expected to remain in seat? NO Feeling restless, or running around when not expected? YES Unable to engage in leisurely activities quietly? NO Always on the go, "driven by a motor"? YES Excessive talking? YES Blurting out answer, responds to other's questions?  YES Difficulty waiting in line or waiting turn? SOMETIMES Interrupting or intruding on others? YES  Relevant past medical, surgical, family and social history reviewed and updated as indicated. Interim medical history since our last visit reviewed. Allergies and medications reviewed and updated. Outpatient Medications Prior to Visit  Medication Sig Dispense Refill  . acetaminophen (TYLENOL) 500 MG tablet Take 500 mg by mouth every 6 (six) hours as needed for pain.    . fluticasone (FLONASE) 50 MCG/ACT nasal spray Place 2 sprays into both nostrils daily. 16 g 0  . Ibuprofen (ADVIL) 200 MG CAPS Take by mouth as needed.    . loratadine (CLARITIN) 10 MG tablet Take 10 mg by mouth as needed.      . Multiple Vitamin (MULTIVITAMIN) tablet Take 1 tablet by mouth daily.    . pseudoephedrine (SUDAFED) 30 MG tablet Take 30 mg by mouth every 4 (four) hours as needed for congestion.    . cyclobenzaprine (FLEXERIL) 10 MG tablet Take 0.5-1 tablets (5-10 mg total) by mouth 2 (two) times daily as needed for muscle spasms. 25 tablet 0  . oseltamivir (TAMIFLU) 75 MG capsule Take 1 capsule (75 mg total) by mouth 2 (two) times daily. 10 capsule 0  . predniSONE (DELTASONE) 20 MG tablet Take two tablets daily for 3 days followed by one tablet daily for 4 days 10 tablet 0   No facility-administered medications prior to visit.      Per HPI unless specifically indicated in ROS section below Review of Systems     Objective:    BP 120/80 (BP Location: Left Arm, Patient Position: Sitting, Cuff Size: Normal)  Pulse 90   Temp 98.2 F (36.8 C) (Oral)   Wt 219 lb (99.3 kg)   SpO2 99%   BMI 33.30 kg/m   Wt Readings from Last 3 Encounters:  05/19/17 219 lb (99.3 kg)  07/14/16 224 lb (101.6 kg)  06/12/15 210 lb (95.3 kg)    Physical Exam  Constitutional: He appears well-developed and well-nourished. No distress.  Nursing note and vitals reviewed.     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 Reviewed this with patient along with work strategies for success ie minimizing external stimuli - this is difficult at his current job.  Discussed stimulant vs non stimulant treatment. Will start with non stimulant atomoxetine 40mg  daily with discussion on mechanism of action and common side effects to watch for.  If ineffective, unaffordable or intolerable, will likely transition to stimulants - discussed I would need to have childhood records to review prior to treating with stimulant.  RTC 3-4 wks f/u visit.  Pt agrees with plan.           Meds ordered this encounter  Medications  . atomoxetine (STRATTERA) 40 MG capsule    Sig: Take 1 capsule (40 mg total) by mouth daily.    Dispense:  30 capsule    Refill:  3   No orders of the defined types were placed in this encounter.   Follow up plan: Return if symptoms worsen or fail to improve.  Eustaquio Boyden, MD

## 2017-05-19 NOTE — Patient Instructions (Addendum)
Search for records of initial diagnosis. Let's start strattera 40mg  daily at pharmacy.  Return in 3-4 wks for follow up visit.   Atomoxetine capsules What is this medicine? ATOMOXETINE (AT oh mox e teen) is used to treat attention deficit/hyperactivity disorder, also known as ADHD. It is not a stimulant like other drugs for ADHD. This drug can improve attention span, concentration, and emotional control. It can also reduce restless or overactive behavior. This medicine may be used for other purposes; ask your health care provider or pharmacist if you have questions. COMMON BRAND NAME(S): Strattera What should I tell my health care provider before I take this medicine? They need to know if you have any of these conditions: -glaucoma -high or low blood pressure -history of stroke -irregular heartbeat or other cardiac disease -liver disease -mania or bipolar disorder -pheochromocytoma -suicidal thoughts -an unusual or allergic reaction to atomoxetine, other medicines, foods, dyes, or preservatives -pregnant or trying to get pregnant -breast-feeding How should I use this medicine? Take this medicine by mouth with a glass of water. Follow the directions on the prescription label. You can take it with or without food. If it upsets your stomach, take it with food. If you have difficulty sleeping and you take more than 1 dose per day, take your last dose before 6 PM. Take your medicine at regular intervals. Do not take it more often than directed. Do not stop taking except on your doctor's advice. A special MedGuide will be given to you by the pharmacist with each prescription and refill. Be sure to read this information carefully each time. Talk to your pediatrician regarding the use of this medicine in children. While this drug may be prescribed for children as young as 6 years for selected conditions, precautions do apply. Overdosage: If you think you have taken too much of this medicine contact  a poison control center or emergency room at once. NOTE: This medicine is only for you. Do not share this medicine with others. What if I miss a dose? If you miss a dose, take it as soon as you can. If it is almost time for your next dose, take only that dose. Do not take double or extra doses. What may interact with this medicine? Do not take this medicine with any of the following medications: -cisapride -dofetilide -dronedarone -MAOIs like Carbex, Eldepryl, Marplan, Nardil, and Parnate -pimozide -reboxetine -thioridazine -ziprasidone This medicine may also interact with the following medications: -certain medicines for blood pressure, heart disease, irregular heart beat -certain medicines for depression, anxiety, or psychotic disturbances -certain medicines for lung disease like albuterol -cold or allergy medicines -fluoxetine -medicines that increase blood pressure like dopamine, dobutamine, or ephedrine -other medicines that prolong the QT interval (cause an abnormal heart rhythm) -paroxetine -quinidine -stimulant medicines for attention disorders, weight loss, or to stay awake This list may not describe all possible interactions. Give your health care provider a list of all the medicines, herbs, non-prescription drugs, or dietary supplements you use. Also tell them if you smoke, drink alcohol, or use illegal drugs. Some items may interact with your medicine. What should I watch for while using this medicine? It may take a week or more for this medicine to take effect. This is why it is very important to continue taking the medicine and not miss any doses. If you have been taking this medicine regularly for some time, do not suddenly stop taking it. Ask your doctor or health care professional for advice. Rarely, this  medicine may increase thoughts of suicide or suicide attempts in children and teenagers. Call your child's health care professional right away if your child or teenager  has new or increased thoughts of suicide or has changes in mood or behavior like becoming irritable or anxious. Regularly monitor your child for these behavioral changes. For males, contact you doctor or health care professional right away if you have an erection that lasts longer than 4 hours or if it becomes painful. This may be a sign of serious problem and must be treated right away to prevent permanent damage. You may get drowsy or dizzy. Do not drive, use machinery, or do anything that needs mental alertness until you know how this medicine affects you. Do not stand or sit up quickly, especially if you are an older patient. This reduces the risk of dizzy or fainting spells. Alcohol can make you more drowsy and dizzy. Avoid alcoholic drinks. Do not treat yourself for coughs, colds or allergies without asking your doctor or health care professional for advice. Some ingredients can increase possible side effects. Your mouth may get dry. Chewing sugarless gum or sucking hard candy, and drinking plenty of water will help. What side effects may I notice from receiving this medicine? Side effects that you should report to your doctor or health care professional as soon as possible: -allergic reactions like skin rash, itching or hives, swelling of the face, lips, or tongue -breathing problems -chest pain -dark urine -fast, irregular heartbeat -general ill feeling or flu-like symptoms -high blood pressure -males: prolonged or painful erection -stomach pain or tenderness -trouble passing urine or change in the amount of urine -vomiting -weight loss -yellowing of the eyes or skin Side effects that usually do not require medical attention (report to your doctor or health care professional if they continue or are bothersome): -change in sex drive or performance -constipation or diarrhea -headache -loss of appetite -menstrual period irregularities -nausea -stomach upset This list may not describe  all possible side effects. Call your doctor for medical advice about side effects. You may report side effects to FDA at 1-800-FDA-1088. Where should I keep my medicine? Keep out of the reach of children. Store at room temperature between 15 and 30 degrees C (59 and 86 degrees F). Throw away any unused medication after the expiration date. NOTE: This sheet is a summary. It may not cover all possible information. If you have questions about this medicine, talk to your doctor, pharmacist, or health care provider.  2018 Elsevier/Gold Standard (2013-07-05 15:29:22)

## 2017-06-16 ENCOUNTER — Encounter: Payer: Self-pay | Admitting: Family Medicine

## 2017-06-16 ENCOUNTER — Ambulatory Visit: Payer: BLUE CROSS/BLUE SHIELD | Admitting: Family Medicine

## 2017-06-16 VITALS — BP 116/70 | HR 88 | Temp 98.0°F | Ht 68.0 in | Wt 213.0 lb

## 2017-06-16 DIAGNOSIS — J302 Other seasonal allergic rhinitis: Secondary | ICD-10-CM

## 2017-06-16 DIAGNOSIS — F900 Attention-deficit hyperactivity disorder, predominantly inattentive type: Secondary | ICD-10-CM | POA: Diagnosis not present

## 2017-06-16 MED ORDER — ATOMOXETINE HCL 80 MG PO CAPS: 80.0000 mg | ORAL_CAPSULE | Freq: Every day | ORAL | 6 refills | Status: DC

## 2017-06-16 MED ORDER — FLUTICASONE PROPIONATE 50 MCG/ACT NA SUSP: 2.0000 | Freq: Every day | NASAL | 6 refills | Status: DC

## 2017-06-16 NOTE — Assessment & Plan Note (Signed)
flonase refilled.

## 2017-06-16 NOTE — Patient Instructions (Addendum)
I'm glad you're doing better on straterra - increase dosing to 80mg  once daily.  Keep me updated with how you're doing.

## 2017-06-16 NOTE — Progress Notes (Signed)
BP 116/70 (BP Location: Left Arm, Patient Position: Sitting, Cuff Size: Normal)   Pulse 88   Temp 98 F (36.7 C) (Oral)   Ht 5\' 8"  (1.727 m)   Wt 213 lb (96.6 kg)   SpO2 97%   BMI 32.39 kg/m    CC: 4 wk f/u visit Subjective:    Patient ID: Gerald [name], male    DOB: 02/04/1981, 37 y.o.   MRN: [id number]  HPI: Gerald [name] is a 37 y.o. male presenting on 06/16/2017 for ADHD (Here for 4 wk follow-up.)   See prior note for details. Longstanding dx ADHD since childhood, self stopped ritalin.  As symptoms had started affecting home/work performance he was interested in restarting medication.  He feels he's a bit more focused, less easily distracted.   Does not have access to initial testing.   Relevant past medical, surgical, family and social history reviewed and updated as indicated. Interim medical history since our last visit reviewed. Allergies and medications reviewed and updated. Outpatient Medications Prior to Visit  Medication Sig Dispense Refill  . acetaminophen (TYLENOL) 500 MG tablet Take 500 mg by mouth every 6 (six) hours as needed for pain.    . Ibuprofen (ADVIL) 200 MG CAPS Take by mouth as needed.    . loratadine (CLARITIN) 10 MG tablet Take 10 mg by mouth as needed.      . Multiple Vitamin (MULTIVITAMIN) tablet Take 1 tablet by mouth daily.    . pseudoephedrine (SUDAFED) 30 MG tablet Take 30 mg by mouth every 4 (four) hours as needed for congestion.    Marland Kitchen. atomoxetine (STRATTERA) 40 MG capsule Take 1 capsule (40 mg total) by mouth daily. 30 capsule 3  . fluticasone (FLONASE) 50 MCG/ACT nasal spray Place 2 sprays into both nostrils daily. 16 g 0   No facility-administered medications prior to visit.      Per HPI unless specifically indicated in ROS section below Review of Systems     Objective:    BP 116/70 (BP Location: Left Arm, Patient Position: Sitting, Cuff Size: Normal)   Pulse 88   Temp 98 F (36.7 C) (Oral)   Ht 5\' 8"  (1.727 m)   Wt 213 lb  (96.6 kg)   SpO2 97%   BMI 32.39 kg/m   Wt Readings from Last 3 Encounters:  06/16/17 213 lb (96.6 kg)  05/19/17 219 lb (99.3 kg)  07/14/16 224 lb (101.6 kg)    Physical Exam  Constitutional: He appears well-developed and well-nourished. No distress.  Psychiatric: He has a normal mood and affect. His behavior is normal.  Evidently more focused today  Nursing note and vitals reviewed.     Assessment & Plan:   Problem List Items Addressed This Visit    ADHD (attention deficit hyperactivity disorder), inattentive type - Primary    Doing much better on straterra 40mg  - but still notices some inattention - will increase to 80mg  dose. Update with effect. Pt agrees with plan. He does not have access to childhood psychological testing for ADHD.       Allergic rhinitis    flonase refilled.       Relevant Medications   fluticasone (FLONASE) 50 MCG/ACT nasal spray       Meds ordered this encounter  Medications  . fluticasone (FLONASE) 50 MCG/ACT nasal spray    Sig: Place 2 sprays into both nostrils daily.    Dispense:  16 g    Refill:  6  . atomoxetine (STRATTERA)  80 MG capsule    Sig: Take 1 capsule (80 mg total) by mouth daily.    Dispense:  30 capsule    Refill:  6   No orders of the defined types were placed in this encounter.   Follow up plan: No follow-ups on file.  Eustaquio Boyden, MD

## 2017-06-16 NOTE — Assessment & Plan Note (Signed)
Doing much better on straterra 40mg  - but still notices some inattention - will increase to 80mg  dose. Update with effect. Pt agrees with plan. He does not have access to childhood psychological testing for ADHD.

## 2017-07-19 ENCOUNTER — Encounter: Payer: Self-pay | Admitting: Family Medicine

## 2017-07-19 ENCOUNTER — Ambulatory Visit: Payer: BLUE CROSS/BLUE SHIELD | Admitting: Family Medicine

## 2017-07-19 ENCOUNTER — Encounter: Payer: Self-pay | Admitting: Emergency Medicine

## 2017-07-19 DIAGNOSIS — J22 Unspecified acute lower respiratory infection: Secondary | ICD-10-CM

## 2017-07-19 MED ORDER — BENZONATATE 100 MG PO CAPS: 100.0000 mg | ORAL_CAPSULE | Freq: Three times a day (TID) | ORAL | 0 refills | Status: DC | PRN

## 2017-07-19 MED ORDER — ALBUTEROL SULFATE HFA 108 (90 BASE) MCG/ACT IN AERS
2.0000 | INHALATION_SPRAY | RESPIRATORY_TRACT | 1 refills | Status: DC | PRN
Start: 2017-07-19 — End: 2017-12-12

## 2017-07-19 MED ORDER — AZITHROMYCIN 250 MG PO TABS: ORAL_TABLET | ORAL | 0 refills | Status: DC

## 2017-07-19 MED ORDER — IPRATROPIUM-ALBUTEROL 0.5-2.5 (3) MG/3ML IN SOLN
3.0000 mL | Freq: Once | RESPIRATORY_TRACT | Status: AC
Start: 2017-07-19 — End: 2017-07-19
  Administered 2017-07-19: 3 mL via RESPIRATORY_TRACT

## 2017-07-19 NOTE — Patient Instructions (Signed)
Good to see you today  I have sent an antibiotic, cough pills and inhaler to your pharmacy  Please use your inhaler every 4-6 hours today and tomorrow and then as needed for cough, tightness, wheeze  Can continue sudafed, Claritin, can add Mucinex if needed to thin sputum.   If not better in 5-7 days or if you get worse, please follow up

## 2017-07-19 NOTE — Progress Notes (Signed)
Subjective:    Patient ID: Gerald [name], male    DOB: 1980/11/07, 37 y.o.   MRN: [id number]  HPI This is a 37 yo male who presents today with 1.5 weeks of sinus pressure, drainage. Has been taking sudafed, Robitussin, Claritin. Has developed cough that has got worse in last couple of days. Chest feels tight. Cough is dry. Cough worse at night. No wheeze, some SOB. Has history of pneumonia. No history of asthma.   Past Medical History:  Diagnosis Date  . ADHD (attention deficit hyperactivity disorder), combined type   . Dyslipidemia   . History of chicken pox   . History of pneumonia 1988, 2014   walking PNA 08/2012  . Obesity    Past Surgical History:  Procedure Laterality Date  . ADENOIDECTOMY  1984   Family History  Problem Relation Age of Onset  . Diabetes Sister        Type 2  . Asthma Sister   . Pulmonary embolism Sister   . Liver disease Sister        ESLD  . Protein S deficiency Sister   . Coronary artery disease Sister 36       CAD/MI  . COPD Maternal Grandmother   . Cancer Maternal Grandfather        Leukemia  . Pulmonary embolism Paternal Grandfather   . Cancer Paternal Grandfather 67       prostate, skin  . Cancer Maternal Aunt        breast (40s), colon (50s), smoker  . Emphysema Maternal Aunt    Social History   Tobacco Use  . Smoking status: Former Games developer  . Smokeless tobacco: Never Used  . Tobacco comment: quit 2001  Substance Use Topics  . Alcohol use: Yes    Comment: Occassional  . Drug use: No      Review of Systems Per HPI    Objective:   Physical Exam  Constitutional: He is oriented to person, place, and time. He appears well-developed and well-nourished. No distress.  HENT:  Head: Normocephalic and atraumatic.  Right Ear: Tympanic membrane, external ear and ear canal normal.  Left Ear: Tympanic membrane, external ear and ear canal normal.  Nose: Mucosal edema and rhinorrhea present.  Mouth/Throat: Uvula is midline and mucous  membranes are normal. Posterior oropharyngeal erythema present. No oropharyngeal exudate, posterior oropharyngeal edema or tonsillar abscesses.  Eyes: Conjunctivae are normal.  Neck: Normal range of motion. Neck supple.  Cardiovascular: Normal rate, regular rhythm and normal heart sounds.  Pulmonary/Chest: Effort normal. No stridor. No respiratory distress. He has no wheezes. He has no rales.  Rhonchi right lower lobe. Frequent, dry cough witnessed.   Neurological: He is alert and oriented to person, place, and time.  Skin: Skin is warm and dry. He is not diaphoretic.  Psychiatric: He has a normal mood and affect. His behavior is normal. Judgment and thought content normal.  Vitals reviewed.     BP 136/90 (BP Location: Right Arm, Patient Position: Sitting, Cuff Size: Normal)   Pulse 100   Temp 97.7 F (36.5 C) (Oral)   Wt 211 lb (95.7 kg)   SpO2 97%   BMI 32.08 kg/m  Wt Readings from Last 3 Encounters:  07/19/17 211 lb (95.7 kg)  06/16/17 213 lb (96.6 kg)  05/19/17 219 lb (99.3 kg)   Patient given Duo neb treatment in office. At completion, he reports subjective improvement, cough decreased and rhonchi resolved.     Assessment & Plan:  1. Lower respiratory tract infection - given duration of symptoms and history of pneumonia, will treat with antibiotic - Provided written and verbal information regarding diagnosis and treatment. - RTC precautions reviewed - azithromycin (ZITHROMAX) 250 MG tablet; Take 2 tabs PO x 1 dose, then 1 tab PO QD x 4 days  Dispense: 6 tablet; Refill: 0 - albuterol (PROVENTIL HFA;VENTOLIN HFA) 108 (90 Base) MCG/ACT inhaler; Inhale 2 puffs into the lungs every 4 (four) hours as needed for wheezing or shortness of breath (cough, shortness of breath or wheezing.).  Dispense: 1 Inhaler; Refill: 1 - benzonatate (TESSALON) 100 MG capsule; Take 1-2 capsules (100-200 mg total) by mouth 3 (three) times daily as needed for cough.  Dispense: 40 capsule; Refill: 0 -  ipratropium-albuterol (DUONEB) 0.5-2.5 (3) MG/3ML nebulizer solution 3 mL   Olean Ree, FNP-BC  [facility], [facility]  07/19/2017 2:26 PM

## 2017-07-21 ENCOUNTER — Telehealth: Payer: Self-pay | Admitting: Family Medicine

## 2017-07-21 NOTE — Telephone Encounter (Signed)
Copied from CRM [id number]. Topic: Quick Communication - See Telephone Encounter >> Jul 21, 2017 12:29 PM Eston Mould B wrote: CRM for notification. See Telephone encounter for: 07/21/17.  PT is not feeling any better,  he doesn't feel like the zpac is helping, he is asking if he can get a higher dosage medication    [id number] Judithann Sheen, [place] - [address] [phone] (Phone) [phone] (Fax)

## 2017-07-21 NOTE — Telephone Encounter (Signed)
Called and spoke with patient informing him of results and recommendations. Understanding verbalized nothing further needed at this time.

## 2017-07-21 NOTE — Telephone Encounter (Signed)
Please call patient and tell him that the antibiotic he was prescribed is very strong. It typically takes several days to work. If he develops fever over 101, shortness of breath or severe pain, he should go to the ER. He needs to rest and drink enough fluids to make his urine light yellow.

## 2017-09-13 ENCOUNTER — Ambulatory Visit: Payer: Self-pay | Admitting: *Deleted

## 2017-09-13 NOTE — Telephone Encounter (Signed)
Patient suspects he was bitten by a spider while playing golf on July 4th. Noticed a dime-sized swollen red area on top of left foot approximately 2-3 days after playing golf. Area is not purplish/draining/no red streak from center of bite.He  Denies abdominal pain, muscle cramps, N/V. Advised to continue cleaning and apply abx ointment and ice for minor tenderness if needed. Observe for s/sx we discussed and call for appointment if noticed.   Reason for Disposition . Non-serious spider bite  Answer Assessment - Initial Assessment Questions 1. TYPE of SPIDER: "What type of spider was it?"  (e.g., name, unknown, or brief description)     Does not know. Bit by  Something on July 4th. 2. LOCATION: "Where is the bite located?"     On top of left foot 3. PAIN: "Is there any pain?" If so, ask: "How bad is it?"  (Scale 1-10; or mild, moderate, severe)     Sore to touch. 4. SWELLING: "How big is the swelling?" (Inches, cm or compare to coins)      Size of a dime. 5. ONSET: "When did the bite occur?" (Minutes or hours ago)      6 days ago 6. TETANUS: "When was the last tetanus booster?"      Several years ago.   7. OTHER SYMPTOMS: "Do you have any other symptoms?"  (e.g., muscle cramps, abdominal pain, change in urine color)     no    Last TD on file 1/19.  Protocols used: SPIDER BITE - [place]-A-AH

## 2017-12-12 ENCOUNTER — Ambulatory Visit (INDEPENDENT_AMBULATORY_CARE_PROVIDER_SITE_OTHER): Payer: BLUE CROSS/BLUE SHIELD | Admitting: Family Medicine

## 2017-12-12 ENCOUNTER — Encounter: Payer: Self-pay | Admitting: Family Medicine

## 2017-12-12 VITALS — BP 126/82 | HR 77 | Temp 98.0°F | Ht 67.5 in | Wt 211.2 lb

## 2017-12-12 DIAGNOSIS — Z0000 Encounter for general adult medical examination without abnormal findings: Secondary | ICD-10-CM | POA: Diagnosis not present

## 2017-12-12 DIAGNOSIS — E669 Obesity, unspecified: Secondary | ICD-10-CM

## 2017-12-12 DIAGNOSIS — E041 Nontoxic single thyroid nodule: Secondary | ICD-10-CM | POA: Diagnosis not present

## 2017-12-12 DIAGNOSIS — E66811 Obesity, class 1: Secondary | ICD-10-CM

## 2017-12-12 DIAGNOSIS — F900 Attention-deficit hyperactivity disorder, predominantly inattentive type: Secondary | ICD-10-CM

## 2017-12-12 DIAGNOSIS — E785 Hyperlipidemia, unspecified: Secondary | ICD-10-CM

## 2017-12-12 NOTE — Progress Notes (Signed)
BP 126/82 (BP Location: Left Arm, Patient Position: Sitting, Cuff Size: Large)   Pulse 77   Temp 98 F (36.7 C) (Oral)   Ht 5' 7.5" (1.715 m)   Wt 211 lb 4 oz (95.8 kg)   SpO2 96%   BMI 32.60 kg/m    CC: CPE Subjective:    Patient ID: Gerald [name], male    DOB: Apr 01, 1980, 37 y.o.   MRN: [id number]  HPI: Gerald [name] is a 37 y.o. male presenting on 12/12/2017 for Annual Exam   ADHD - on strattera 80mg  daily, tolerating well.  Just had labs done through work biometric screen. A1c 5.8%.  Changed jobs at [employer] with significant relief in work stress.   Preventative: Flu shot - declines  Td - 03/2010  Seat belt use discussed.  Sunscreen use discussed. No changing moles.  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 Interim medical history since our last visit reviewed. Allergies and medications reviewed and updated. Outpatient Medications Prior to Visit  Medication Sig Dispense Refill  . acetaminophen (TYLENOL) 500 MG tablet Take 500 mg by mouth every 6 (six) hours as needed for pain.    Marland Kitchen atomoxetine (STRATTERA) 80 MG capsule Take 1 capsule (80 mg total) by mouth daily. 30 capsule 6  . fluticasone (FLONASE) 50 MCG/ACT nasal spray Place 2 sprays into both nostrils daily. (Patient taking differently: Place 2 sprays into both nostrils daily. As needed) 16 g 6  . Ibuprofen (ADVIL) 200 MG CAPS Take by mouth as needed.    . loratadine (CLARITIN) 10 MG tablet Take 10 mg by mouth as needed.      . Multiple Vitamin (MULTIVITAMIN) tablet Take 1 tablet by mouth daily.    . pseudoephedrine (SUDAFED) 30 MG tablet Take 30 mg by mouth  every 4 (four) hours as needed for congestion.    Marland Kitchen albuterol (PROVENTIL HFA;VENTOLIN HFA) 108 (90 Base) MCG/ACT inhaler Inhale 2 puffs into the lungs every 4 (four) hours as needed for wheezing or shortness of breath (cough, shortness of breath or wheezing.). 1 Inhaler 1  . azithromycin (ZITHROMAX) 250 MG tablet Take 2 tabs PO x 1 dose, then 1 tab PO QD x 4 days 6 tablet 0  . benzonatate (TESSALON) 100 MG capsule Take 1-2 capsules (100-200 mg total) by mouth 3 (three) times daily as needed for cough. 40 capsule 0   No facility-administered medications prior to visit.      Per HPI unless specifically indicated in ROS section below Review of Systems  Constitutional: Negative for activity change, appetite change, chills, fatigue, fever and unexpected weight change.  HENT: Negative for hearing loss.   Eyes: Negative for visual disturbance.  Respiratory: Negative for cough, chest tightness, shortness of breath and wheezing.   Cardiovascular: Negative for chest pain, palpitations and leg swelling.  Gastrointestinal: Negative for abdominal distention, abdominal pain, blood in stool, constipation, diarrhea, nausea and vomiting.  Genitourinary: Negative for difficulty urinating and hematuria.  Musculoskeletal: Negative for arthralgias, myalgias and neck pain.  Skin: Negative for rash.  Neurological: Positive for headaches. Negative for dizziness, seizures and syncope.  Hematological: Negative for adenopathy. Does not  bruise/bleed easily.  Psychiatric/Behavioral: Negative for dysphoric mood. The patient is not nervous/anxious.        Objective:    BP 126/82 (BP Location: Left Arm, Patient Position: Sitting, Cuff Size: Large)   Pulse 77   Temp 98 F (36.7 C) (Oral)   Ht 5' 7.5" (1.715 m)   Wt 211 lb 4 oz (95.8 kg)   SpO2 96%   BMI 32.60 kg/m   Wt Readings from Last 3 Encounters:  12/12/17 211 lb 4 oz (95.8 kg)  07/19/17 211 lb (95.7 kg)  06/16/17 213 lb (96.6 kg)    Physical Exam    Constitutional: He is oriented to person, place, and time. He appears well-developed and well-nourished. No distress.  HENT:  Head: Normocephalic and atraumatic.  Right Ear: Hearing, tympanic membrane, external ear and ear canal normal.  Left Ear: Hearing, tympanic membrane, external ear and ear canal normal.  Nose: Nose normal.  Mouth/Throat: Uvula is midline, oropharynx is clear and moist and mucous membranes are normal. No oropharyngeal exudate, posterior oropharyngeal edema or posterior oropharyngeal erythema.  Eyes: Pupils are equal, round, and reactive to light. Conjunctivae and EOM are normal. No scleral icterus.  Neck: Normal range of motion. Neck supple. No thyromegaly present.  Possible R sided thyroid nodule palpated  Cardiovascular: Normal rate, regular rhythm, normal heart sounds and intact distal pulses.  No murmur heard. Pulses:      Radial pulses are 2+ on the right side, and 2+ on the left side.  Pulmonary/Chest: Effort normal and breath sounds normal. No respiratory distress. He has no wheezes. He has no rales.  Abdominal: Soft. Bowel sounds are normal. He exhibits no distension and no mass. There is no tenderness. There is no rebound and no guarding.  Musculoskeletal: Normal range of motion. He exhibits no edema.  Lymphadenopathy:    He has no cervical adenopathy.  Neurological: He is alert and oriented to person, place, and time.  CN grossly intact, station and gait intact  Skin: Skin is warm and dry. No rash noted.  Psychiatric: He has a normal mood and affect. His behavior is normal. Judgment and thought content normal.  Nursing note and vitals reviewed.     Assessment & Plan:   Problem List Items Addressed This Visit    Thyroid nodule    Check TSH/fT4 today.  Discussed [place] - pt would like to hold for now.  Will likely recommend thyroid ultrasound pending TFTs.       Relevant Orders   TSH   T4, free   Obesity, Class I, BMI 30-34.9    Encouraged healthy  lifestyle including routine exercise to attain sustainable weight loss.       Health maintenance examination - Primary    Preventative protocols reviewed and updated unless pt declined. Discussed healthy diet and lifestyle.       Dyslipidemia    I have asked him to bring recent labs to review and update chart.       ADHD (attention deficit hyperactivity disorder), inattentive type    Stable period on strattera 80mg  daily - continue.           No orders of the defined types were placed in this encounter.  Orders Placed This Encounter  Procedures  . TSH  . T4, free    Follow up plan: Return in about 1 year (around 12/13/2018) for annual exam, prior fasting for blood work.  Eustaquio Boyden, MD

## 2017-12-12 NOTE — Assessment & Plan Note (Signed)
Stable period on strattera 80mg  daily - continue.

## 2017-12-12 NOTE — Assessment & Plan Note (Addendum)
I have asked him to bring recent labs to review and update chart.

## 2017-12-12 NOTE — Assessment & Plan Note (Signed)
Encouraged healthy lifestyle including routine exercise to attain sustainable weight loss.

## 2017-12-12 NOTE — Patient Instructions (Addendum)
Bring me copy of recent labs from work Thyroid check today. We will watch right side of thyroid gland.  You are doing well today  Return as needed or in 1 year for next physical.   Health Maintenance, Male A healthy lifestyle and preventive care is important for your health and wellness. Ask your health care provider about what schedule of regular examinations is right for you. What should I know about weight and diet? Eat a Healthy Diet  Eat plenty of vegetables, fruits, whole grains, low-fat dairy products, and lean protein.  Do not eat a lot of foods high in solid fats, added sugars, or salt.  Maintain a Healthy Weight Regular exercise can help you achieve or maintain a healthy weight. You should:  Do at least 150 minutes of exercise each week. The exercise should increase your heart rate and make you sweat (moderate-intensity exercise).  Do strength-training exercises at least twice a week.  Watch Your Levels of Cholesterol and Blood Lipids  Have your blood tested for lipids and cholesterol every 5 years starting at 39 years of age. If you are at high risk for heart disease, you should start having your blood tested when you are 39 years old. You may need to have your cholesterol levels checked more often if: ? Your lipid or cholesterol levels are high. ? You are older than 39 years of age. ? You are at high risk for heart disease.  What should I know about cancer screening? Many types of cancers can be detected early and may often be prevented. Lung Cancer  You should be screened every year for lung cancer if: ? You are a current smoker who has smoked for at least 30 years. ? You are a former smoker who has quit within the past 15 years.  Talk to your health care provider about your screening options, when you should start screening, and how often you should be screened.  Colorectal Cancer  Routine colorectal cancer screening usually begins at 39 years of age and should  be repeated every 5-10 years until you are 39 years old. You may need to be screened more often if early forms of precancerous polyps or small growths are found. Your health care provider may recommend screening at an earlier age if you have risk factors for colon cancer.  Your health care provider may recommend using home test kits to check for hidden blood in the stool.  A small camera at the end of a tube can be used to examine your colon (sigmoidoscopy or colonoscopy). This checks for the earliest forms of colorectal cancer.  Prostate and Testicular Cancer  Depending on your age and overall health, your health care provider may do certain tests to screen for prostate and testicular cancer.  Talk to your health care provider about any symptoms or concerns you have about testicular or prostate cancer.  Skin Cancer  Check your skin from head to toe regularly.  Tell your health care provider about any new moles or changes in moles, especially if: ? There is a change in a mole's size, shape, or color. ? You have a mole that is larger than a pencil eraser.  Always use sunscreen. Apply sunscreen liberally and repeat throughout the day.  Protect yourself by wearing long sleeves, pants, a wide-brimmed hat, and sunglasses when outside.  What should I know about heart disease, diabetes, and high blood pressure?  If you are 3-22 years of age, have your blood pressure checked  every 3-5 years. If you are 32 years of age or older, have your blood pressure checked every year. You should have your blood pressure measured twice-once when you are at a hospital or clinic, and once when you are not at a hospital or clinic. Record the average of the two measurements. To check your blood pressure when you are not at a hospital or clinic, you can use: ? An automated blood pressure machine at a pharmacy. ? A home blood pressure monitor.  Talk to your health care provider about your target blood  pressure.  If you are between 67-33 years old, ask your health care provider if you should take aspirin to prevent heart disease.  Have regular diabetes screenings by checking your fasting blood sugar level. ? If you are at a normal weight and have a low risk for diabetes, have this test once every three years after the age of 79. ? If you are overweight and have a high risk for diabetes, consider being tested at a younger age or more often.  A one-time screening for abdominal aortic aneurysm (AAA) by ultrasound is recommended for men aged 78-75 years who are current or former smokers. What should I know about preventing infection? Hepatitis B If you have a higher risk for hepatitis B, you should be screened for this virus. Talk with your health care provider to find out if you are at risk for hepatitis B infection. Hepatitis C Blood testing is recommended for:  Everyone born from [birth year] through 1967.  Anyone with known risk factors for hepatitis C.  Sexually Transmitted Diseases (STDs)  You should be screened each year for STDs including gonorrhea and chlamydia if: ? You are sexually active and are younger than 39 years of age. ? You are older than 39 years of age and your health care provider tells you that you are at risk for this type of infection. ? Your sexual activity has changed since you were last screened and you are at an increased risk for chlamydia or gonorrhea. Ask your health care provider if you are at risk.  Talk with your health care provider about whether you are at high risk of being infected with HIV. Your health care provider may recommend a prescription medicine to help prevent HIV infection.  What else can I do?  Schedule regular health, dental, and eye exams.  Stay current with your vaccines (immunizations).  Do not use any tobacco products, such as cigarettes, chewing tobacco, and e-cigarettes. If you need help quitting, ask your health care  provider.  Limit alcohol intake to no more than 2 drinks per day. One drink equals 12 ounces of beer, 5 ounces of wine, or 1 ounces of hard liquor.  Do not use street drugs.  Do not share needles.  Ask your health care provider for help if you need support or information about quitting drugs.  Tell your health care provider if you often feel depressed.  Tell your health care provider if you have ever been abused or do not feel safe at home. This information is not intended to replace advice given to you by your health care provider. Make sure you discuss any questions you have with your health care provider. Document Released: 08/20/2007 Document Revised: 10/21/2015 Document Reviewed: 11/25/2014 Elsevier Interactive Patient Education  [employer].

## 2017-12-12 NOTE — Assessment & Plan Note (Signed)
Check TSH/fT4 today.  Discussed [place] - pt would like to hold for now.  Will likely recommend thyroid ultrasound pending TFTs.

## 2017-12-12 NOTE — Assessment & Plan Note (Signed)
Preventative protocols reviewed and updated unless pt declined. Discussed healthy diet and lifestyle.  

## 2017-12-13 LAB — T4, FREE: FREE T4: 1.14 ng/dL (ref 0.82–1.77)

## 2017-12-13 LAB — TSH: TSH: 3.56 u[IU]/mL (ref 0.450–4.500)

## 2017-12-14 ENCOUNTER — Encounter: Payer: Self-pay | Admitting: Family Medicine

## 2017-12-14 ENCOUNTER — Telehealth: Payer: Self-pay | Admitting: Family Medicine

## 2017-12-14 NOTE — Telephone Encounter (Signed)
Call returned to patient. Pt informed that lab results have not been reviewed. He has left message in My Chart for PCP. He was advised to call again tomorrow.

## 2017-12-14 NOTE — Telephone Encounter (Signed)
Copied from CRM [id number]. Topic: Quick Communication - See Telephone Encounter >> Dec 14, 2017  2:19 PM Angela Nevin wrote: CRM for notification. See Telephone encounter for: 12/14/17.  Pt calling to check status of lab results from 10/8.

## 2017-12-16 NOTE — Telephone Encounter (Signed)
Released via mychart

## 2017-12-18 ENCOUNTER — Telehealth: Payer: Self-pay | Admitting: *Deleted

## 2017-12-18 DIAGNOSIS — E041 Nontoxic single thyroid nodule: Secondary | ICD-10-CM

## 2017-12-18 NOTE — Telephone Encounter (Signed)
Referral placed.

## 2017-12-18 NOTE — Telephone Encounter (Signed)
See lab results.  

## 2017-12-18 NOTE — Telephone Encounter (Signed)
Copied from CRM [id number]. Topic: Referral - Request for Referral >> Dec 18, 2017  8:21 AM Leafy Ro wrote:  Pt would like to proceed with getting thyroid ultrasound

## 2017-12-21 ENCOUNTER — Ambulatory Visit
Admission: RE | Admit: 2017-12-21 | Discharge: 2017-12-21 | Disposition: A | Discharge status: Home / Self Care | Payer: BLUE CROSS/BLUE SHIELD | Source: Ambulatory Visit | Attending: Family Medicine | Admitting: Family Medicine

## 2017-12-21 DIAGNOSIS — E041 Nontoxic single thyroid nodule: Secondary | ICD-10-CM | POA: Diagnosis not present

## 2018-07-17 ENCOUNTER — Encounter: Payer: Self-pay | Admitting: Family Medicine

## 2018-07-17 MED ORDER — ATOMOXETINE HCL 80 MG PO CAPS: 80.0000 mg | ORAL_CAPSULE | Freq: Every day | ORAL | 6 refills | Status: DC

## 2018-07-17 NOTE — Telephone Encounter (Signed)
E-scribed refill.  Notified pt via MyChart. Fwd to Dr. Reece Agar to address rest of message.

## 2018-09-06 ENCOUNTER — Encounter: Payer: Self-pay | Admitting: Family Medicine

## 2018-09-07 ENCOUNTER — Ambulatory Visit: Payer: Self-pay | Admitting: *Deleted

## 2018-09-07 NOTE — Telephone Encounter (Signed)
Tuesday started with runny nose.   Yesterday and this morning and coughing with tightness in my chest.   I get allergies around this time of year.   No fever.   I'm not short of breath but it's a little tight as I breath deep.    Since the office is closed for the July 4th holiday I instructed him to go to an urgent care or ED since he is experiencing tightness in his chest. He agreed to go to the Urgent Care.      Reason for Disposition . [1] COVID-19 infection suspected by caller or triager AND [2] mild symptoms (cough, fever, or others) AND [6] no complications or SOB  Answer Assessment - Initial Assessment Questions 1. COVID-19 DIAGNOSIS: "Who made your Coronavirus (COVID-19) diagnosis?" "Was it confirmed by a positive lab test?" If not diagnosed by a HCP, ask "Are there lots of cases (community spread) where you live?" (See public health department website, if unsure)     Yes community cases 2. ONSET: "When did the COVID-19 symptoms start?"      Tuesday started with a runny nose 3. WORST SYMPTOM: "What is your worst symptom?" (e.g., cough, fever, shortness of breath, muscle aches)     Tightness in my chest with a deep breath 4. COUGH: "Do you have a cough?" If so, ask: "How bad is the cough?"       Yes 5. FEVER: "Do you have a fever?" If so, ask: "What is your temperature, how was it measured, and when did it start?"     No 6. RESPIRATORY STATUS: "Describe your breathing?" (e.g., shortness of breath, wheezing, unable to speak)      Not short of breath but have tightness with deep breaths 7. BETTER-SAME-WORSE: "Are you getting better, staying the same or getting worse compared to yesterday?"  If getting worse, ask, "In what way?"     Same 8. HIGH RISK DISEASE: "Do you have any chronic medical problems?" (e.g., asthma, heart or lung disease, weak immune system, etc.)     Gets allergies this time of year every year. 9. PREGNANCY: "Is there any chance you are pregnant?" "When was your  last menstrual period?"     N/A 10. OTHER SYMPTOMS: "Do you have any other symptoms?"  (e.g., chills, fatigue, headache, loss of smell or taste, muscle pain, sore throat)       Runny nose, coughing, post nasal drip.  Protocols used: CORONAVIRUS (COVID-19) DIAGNOSED OR SUSPECTED-A-AH

## 2019-04-17 ENCOUNTER — Ambulatory Visit
Admission: EM | Admit: 2019-04-17 | Discharge: 2019-04-17 | Disposition: A | Discharge status: Home / Self Care | Payer: Managed Care, Other (non HMO) | Attending: Family Medicine | Admitting: Family Medicine

## 2019-04-17 ENCOUNTER — Ambulatory Visit (INDEPENDENT_AMBULATORY_CARE_PROVIDER_SITE_OTHER): Payer: Managed Care, Other (non HMO)

## 2019-04-17 ENCOUNTER — Other Ambulatory Visit: Payer: Self-pay

## 2019-04-17 ENCOUNTER — Encounter: Payer: Self-pay | Admitting: Emergency Medicine

## 2019-04-17 DIAGNOSIS — M79604 Pain in right leg: Secondary | ICD-10-CM

## 2019-04-17 MED ORDER — CYCLOBENZAPRINE HCL 10 MG PO TABS: 10.0000 mg | ORAL_TABLET | Freq: Two times a day (BID) | ORAL | 0 refills | Status: DC | PRN

## 2019-04-17 MED ORDER — IBUPROFEN 800 MG PO TABS: 800.0000 mg | ORAL_TABLET | Freq: Three times a day (TID) | ORAL | 0 refills | Status: DC | PRN

## 2019-04-17 NOTE — Discharge Instructions (Addendum)
Take the ibuprofen as prescribed.  Rest and elevate your leg.  Apply ice packs 2-3 times a day for up to 20 minutes each.  Wear the Ace wrap as needed for comfort.    Follow up with your primary care provider or an orthopedist if you symptoms continue or worsen;  Or if you develop new symptoms, such as numbness, tingling, or weakness.    

## 2019-04-17 NOTE — ED Provider Notes (Signed)
[facility]   [id number] 04/17/19 Arrival Time: 1803  [id number] PAIN  SUBJECTIVE: History from: patient. Gerald [name] is a 39 y.o. male complains of right leg pain that began earlier today. Reports that he got up from the couch and felt that his lower leg "separated." Localizes the pain to the R lower leg.  Describes the pain as constant and sharp in character. Has tried OTC medications without relief. Symptoms are made worse with flexion and weight bearing. Denies fever, chills, erythema, ecchymosis, effusion, weakness, numbness and tingling, saddle paresthesias, loss of bowel or bladder function.      ROS: As per HPI.  All other pertinent ROS negative.     Past Medical History:  Diagnosis Date   ADHD (attention deficit hyperactivity disorder), combined type    Dyslipidemia    History of chicken pox    History of pneumonia 1988, 2014   walking PNA 08/2012   Obesity    Past Surgical History:  Procedure Laterality Date   ADENOIDECTOMY  1984   Allergies  Allergen Reactions   Penicillins     REACTION: hives   Biaxin [Clarithromycin] Diarrhea   No current facility-administered medications on file prior to encounter.   Current Outpatient Medications on File Prior to Encounter  Medication Sig Dispense Refill   acetaminophen (TYLENOL) 500 MG tablet Take 500 mg by mouth every 6 (six) hours as needed for pain.     atomoxetine (STRATTERA) 80 MG capsule Take 1 capsule (80 mg total) by mouth daily. 30 capsule 6   fluticasone (FLONASE) 50 MCG/ACT nasal spray Place 2 sprays into both nostrils daily. (Patient taking differently: Place 2 sprays into both nostrils daily. As needed) 16 g 6   loratadine (CLARITIN) 10 MG tablet Take 10 mg by mouth as needed.       Multiple Vitamin (MULTIVITAMIN) tablet Take 1 tablet by mouth daily.     pseudoephedrine (SUDAFED) 30 MG tablet Take 30 mg by mouth every 4 (four) hours as needed for congestion.     Social History    Socioeconomic History   Marital status: Married    Spouse name: Not on file   Number of children: 2   Years of education: Not on file   Highest education level: Not on file  Occupational History    Employer: [employer]    Comment: corporate division  Tobacco Use   Smoking status: Former Smoker   Smokeless tobacco: Never Used   Tobacco comment: quit 2001  Substance and Sexual Activity   Alcohol use: Yes    Comment: Occassional   Drug use: No   Sexual activity: Not on file  Other Topics Concern   Not on file  Social History Narrative   Caffeine: some diet drinks   Lives with wife, 2 sons, 1 cat    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    Activity: golf, walking/running several times a week, wants to run 10k    Diet: water or diet drinks, good vegetables, avoids carbs   Social Determinants of Health   Financial Resource Strain:    Difficulty of Paying Living Expenses: Not on file  Food Insecurity:    Worried About Charity fundraiser in the Last Year: Not on file   [employer] of Food in the Last Year: Not on file  Transportation Needs:    Lack of Transportation (Medical): Not on file   Lack of Transportation (Non-Medical): Not on file  Physical Activity:    Days of Exercise  per Week: Not on file   Minutes of Exercise per Session: Not on file  Stress:    Feeling of Stress : Not on file  Social Connections:    Frequency of Communication with Friends and Family: Not on file   Frequency of Social Gatherings with Friends and Family: Not on file   Attends Religious Services: Not on file   Active Member of Clubs or Organizations: Not on file   Attends Banker Meetings: Not on file   Marital Status: Not on file  Intimate Partner Violence:    Fear of Current or Ex-Partner: Not on file   Emotionally Abused: Not on file   Physically Abused: Not on file   Sexually Abused: Not on file   Family History  Problem Relation Age of Onset   Diabetes  Sister        Type 2   Asthma Sister    Pulmonary embolism Sister    Liver disease Sister        ESLD   Protein S deficiency Sister    Coronary artery disease Sister 67       CAD/MI   Pulmonary embolism Paternal Grandfather    Cancer Paternal Grandfather 5       prostate, skin   COPD Maternal Grandmother    Cancer Maternal Grandfather        Leukemia   Cancer Maternal Aunt        breast (40s), colon (50s), smoker   Emphysema Maternal Aunt     OBJECTIVE:  Vitals:   04/17/19 1811  BP: 122/85  Pulse: 92  Resp: 18  Temp: 97.8 F (36.6 C)  TempSrc: Temporal  SpO2: 98%    General appearance: ALERT; in no acute distress.  Head: NCAT Lungs: Normal respiratory effort CV: pedal pulses 2+ bilaterally. Cap refill < 2 seconds Musculoskeletal:  Inspection: Skin warm, dry, clear and intact without obvious erythema, effusion, or ecchymosis.  Palpation: Tenderness to palpation  ROM: R leg limited AROM with flexion, stops at about 75 degrees Strength: 5/5 shld abduction, 5/5 shld adduction, 5/5 elbow flexion, 5/5 elbow extension, 5/5 grip strength, 5/5 hip flexion, 5/5 knee abduction, 5/5 knee adduction, 5/5 knee flexion, 5/5 knee extension, 5/5 dorsiflexion, 5/5 plantar flexion Stability: Anterior/ posterior drawer intact Skin: warm and dry Neurologic: Ambulates without difficulty; Sensation intact about the upper/ lower extremities Psychological: alert and cooperative; normal mood and affect  DIAGNOSTIC STUDIES:  DG Tibia/Fibula Right  Result Date: 04/17/2019 CLINICAL DATA:  Pain and tenderness after fall EXAM: RIGHT TIBIA AND FIBULA - 2 VIEW COMPARISON:  None. FINDINGS: Frontal and lateral views of the right tibia and fibula are obtained. There are no acute displaced fractures. Alignment of the right knee and ankle is anatomic. Soft tissues are unremarkable. IMPRESSION: 1. Unremarkable right tibia and fibula. Electronically Signed   By: Sharlet Salina M.D.   On:  04/17/2019 18:35     ASSESSMENT & PLAN:  1. Leg pain, medial, right     Meds ordered this encounter  Medications   ibuprofen (ADVIL) 800 MG tablet    Sig: Take 1 tablet (800 mg total) by mouth every 8 (eight) hours as needed for moderate pain.    Dispense:  21 tablet    Refill:  0    Order Specific Question:   Supervising Provider    Answer:   Merrilee Jansky [[id number]]   cyclobenzaprine (FLEXERIL) 10 MG tablet    Sig: Take 1 tablet (10 mg total)  by mouth 2 (two) times daily as needed for muscle spasms.    Dispense:  20 tablet    Refill:  0    Order Specific Question:   Supervising Provider    Answer:   Merrilee Jansky [id number]    Continue conservative management of rest, ice, and gentle stretches Take naproxen as needed for pain relief (may cause abdominal discomfort, ulcers, and GI bleeds avoid taking with other NSAIDs) Take cyclobenzaprine at nighttime for symptomatic relief. Avoid driving or operating heavy machinery while using medication. Follow up with PCP if symptoms persist Return or go to the ER if you have any new or worsening symptoms (fever, chills, chest pain, abdominal pain, changes in bowel or bladder habits, pain radiating into lower legs, etc...)   [place] Controlled Substances Registry consulted for this patient. I feel the risk/benefit ratio today is favorable for proceeding with this prescription for a controlled substance. Medication sedation precautions given.  Reviewed expectations re: course of current medical issues. Questions answered. Outlined signs and symptoms indicating need for more acute intervention. Patient verbalized understanding. After Visit Summary given.      Moshe Cipro, NP 04/17/19 [place]

## 2019-04-17 NOTE — ED Triage Notes (Signed)
Pt presents to [facility] for assessment after he felt a pop and his shin "went a different direction".  Patient states he was trying to twist to stand and get around someone standing in front of him.  States he iced it and took some ibuprofen, and it was hurting but seemed okay, and then he went to walk up the steps, and the pain became excruciating.

## 2019-04-18 ENCOUNTER — Encounter: Payer: Self-pay | Admitting: Family Medicine

## 2019-04-18 ENCOUNTER — Other Ambulatory Visit: Payer: Self-pay

## 2019-04-18 ENCOUNTER — Ambulatory Visit: Payer: Managed Care, Other (non HMO) | Admitting: Family Medicine

## 2019-04-18 VITALS — BP 110/70 | HR 89 | Temp 97.3°F | Wt 226.0 lb

## 2019-04-18 DIAGNOSIS — M23203 Derangement of unspecified medial meniscus due to old tear or injury, right knee: Secondary | ICD-10-CM

## 2019-04-18 DIAGNOSIS — M25561 Pain in right knee: Secondary | ICD-10-CM | POA: Diagnosis not present

## 2019-04-18 MED ORDER — DICLOFENAC SODIUM 75 MG PO TBEC: 75.0000 mg | DELAYED_RELEASE_TABLET | Freq: Two times a day (BID) | ORAL | 3 refills | Status: DC

## 2019-04-18 MED ORDER — TRAMADOL HCL 50 MG PO TABS: 50.0000 mg | ORAL_TABLET | Freq: Three times a day (TID) | ORAL | 0 refills | Status: AC | PRN

## 2019-04-18 NOTE — Progress Notes (Signed)
Gerald [name] T. Gerald Emmanuel, MD Primary Care and Sports Medicine [facility] [place], [address] Phone: [phone]  FAX: [phone]  Gerald [name] - 39 y.o. male  MRN [id number]  Date of Birth: [birth year]  Visit Date: 04/18/2019  PCP: Ria Bush, MD  Referred by: Ria Bush, MD  Chief Complaint  Patient presents with  . Knee Pain    Right-Seen at [facility] yesterday-meds not helping    This visit occurred during the SARS-CoV-2 public health emergency.  Safety protocols were in place, including screening questions prior to the visit, additional usage of staff PPE, and extensive cleaning of exam room while observing appropriate contact time as indicated for disinfecting solutions.   Subjective:   Gerald [name] is a 39 y.o. very pleasant male patient with Body mass index is 34.87 kg/m. who presents with the following:  He is a pleasant gentleman who I saw distantly with a biceps rupture and he presents today with some acute right-sided knee pain.  He is in follow-up for a visit from urgent care and he wanted to follow-up with me here in the office for further management.  Twisting with lateral movement.  He was getting off of the couch.  And he had some acute pain.  Since that time he has developed some significant pain with flexion and extension.  He is able to return to the office without much significant difficulty.  He does have some crutches that he is using now.  Bad medial knee pain.  Will grab and always on medial.  Went to urgent care yesterday.   Independent review of the patient's tib-fib x-ray series.  There is no evidence for acute fracture or dislocation.  There is no evidence of degenerative joint disease with joint space narrowing. Electronically Signed  By: Owens Loffler, MD On: 04/18/2019  2:20 PM EST   Review of Systems is noted in the HPI, as appropriate   Objective:   BP 110/70   Pulse 89    Temp (!) 97.3 F (36.3 C) (Temporal)   Wt 226 lb (102.5 kg)   SpO2 99%   BMI 34.87 kg/m   GEN: No acute distress; alert,appropriate. PULM: Breathing comfortably in no respiratory distress PSYCH: Normally interactive.    Right knee: Mild effusion.  Full extension and flexion to 85 degrees.  Stable to varus and valgus stress.  Lachman is negative.  Drawer testing is negative.  No tenderness with stress of the MCL and LCL.  He does have significant medial joint line tenderness.  He also has significant pain with McMurray's, bounce home testing as well as quite tenderness on flexion pinch.  There are no mechanical symptoms on exam.  Tenderness structures are intact.  Nontender at the tibial plateau as well as the shaft and distal tibia and fibula.  Radiology: DG Tibia/Fibula Right  Result Date: 04/17/2019 CLINICAL DATA:  Pain and tenderness after fall EXAM: RIGHT TIBIA AND FIBULA - 2 VIEW COMPARISON:  None. FINDINGS: Frontal and lateral views of the right tibia and fibula are obtained. There are no acute displaced fractures. Alignment of the right knee and ankle is anatomic. Soft tissues are unremarkable. IMPRESSION: 1. Unremarkable right tibia and fibula. Electronically Signed   By: Randa Ngo M.D.   On: 04/17/2019 18:35    Assessment and Plan:     ICD-10-CM   1. Degenerative tear of medial meniscus of right knee  M23.203   2. Acute pain  of right knee  M25.561    Level of Medical Decision-Making in this case is MODERATE.   The patient is in some significant pain, but I would anticipate that he would do fairly well with some basic conservative measures.  Continue with crutches and wean out of these.  Reviewed meniscal pathology.  Trial of conservative management.  I gave him some Voltaren as well as some tramadol to use for more significant pain.  I did place him in a patellar J brace as well.  Follow-up: Return in about 3 weeks (around 05/09/2019).  Meds ordered this encounter   Medications  . diclofenac (VOLTAREN) 75 MG EC tablet    Sig: Take 1 tablet (75 mg total) by mouth 2 (two) times daily.    Dispense:  60 tablet    Refill:  3  . traMADol (ULTRAM) 50 MG tablet    Sig: Take 1 tablet (50 mg total) by mouth every 8 (eight) hours as needed for up to 5 days for moderate pain.    Dispense:  20 tablet    Refill:  0   There are no discontinued medications. No orders of the defined types were placed in this encounter.   Signed,  Elpidio Galea. Laiya Wisby, MD   Outpatient Encounter Medications as of 04/18/2019  Medication Sig  . atomoxetine (STRATTERA) 80 MG capsule Take 1 capsule (80 mg total) by mouth daily.  . cyclobenzaprine (FLEXERIL) 10 MG tablet Take 1 tablet (10 mg total) by mouth 2 (two) times daily as needed for muscle spasms.  . fluticasone (FLONASE) 50 MCG/ACT nasal spray Place 2 sprays into both nostrils daily. (Patient taking differently: Place 2 sprays into both nostrils daily. As needed)  . loratadine (CLARITIN) 10 MG tablet Take 10 mg by mouth as needed.    . Multiple Vitamin (MULTIVITAMIN) tablet Take 1 tablet by mouth daily.  . pseudoephedrine (SUDAFED) 30 MG tablet Take 30 mg by mouth every 4 (four) hours as needed for congestion.  Marland Kitchen acetaminophen (TYLENOL) 500 MG tablet Take 500 mg by mouth every 6 (six) hours as needed for pain.  Marland Kitchen diclofenac (VOLTAREN) 75 MG EC tablet Take 1 tablet (75 mg total) by mouth 2 (two) times daily.  Marland Kitchen ibuprofen (ADVIL) 800 MG tablet Take 1 tablet (800 mg total) by mouth every 8 (eight) hours as needed for moderate pain. (Patient not taking: Reported on 04/18/2019)  . traMADol (ULTRAM) 50 MG tablet Take 1 tablet (50 mg total) by mouth every 8 (eight) hours as needed for up to 5 days for moderate pain.   No facility-administered encounter medications on file as of 04/18/2019.

## 2019-05-08 ENCOUNTER — Ambulatory Visit: Payer: Managed Care, Other (non HMO) | Admitting: Family Medicine

## 2019-05-08 ENCOUNTER — Other Ambulatory Visit: Payer: Self-pay

## 2019-05-08 ENCOUNTER — Ambulatory Visit (INDEPENDENT_AMBULATORY_CARE_PROVIDER_SITE_OTHER)
Admission: RE | Admit: 2019-05-08 | Discharge: 2019-05-08 | Disposition: A | Discharge status: Home / Self Care | Payer: Managed Care, Other (non HMO) | Source: Ambulatory Visit | Attending: Family Medicine | Admitting: Family Medicine

## 2019-05-08 ENCOUNTER — Encounter: Payer: Self-pay | Admitting: Family Medicine

## 2019-05-08 VITALS — BP 100/76 | HR 90 | Temp 97.7°F | Ht 67.5 in | Wt 224.5 lb

## 2019-05-08 DIAGNOSIS — M898X5 Other specified disorders of bone, thigh: Secondary | ICD-10-CM | POA: Diagnosis not present

## 2019-05-08 DIAGNOSIS — M25561 Pain in right knee: Secondary | ICD-10-CM

## 2019-05-08 NOTE — Patient Instructions (Signed)
REFERRALS TO SPECIALISTS, SPECIAL TESTS (MRI, CT, ULTRASOUNDS)  [facility] or  [facility] will help you.   During the  Covid-19 outbreak we are not having people meet with the patient care coordinators for their protection.  They will call you when your appointment is made.   In the most extreme case (like a stroke), I will try to get them to talk to you in the office, but some insurances require paperwork and authorizations.  This will always take some time.  Specialist appointment times vary a great deal, based on their schedule / openings. -- Some specialists have very long wait times. (Example. Dermatology)    

## 2019-05-08 NOTE — Progress Notes (Signed)
Gerald Erno T. Shailey Butterbaugh, MD Primary Care and Sports Medicine [facility] [place], [address] Phone: [phone]  FAX: [phone]  Gerald [name] - 39 y.o. male  MRN [id number]  Date of Birth: [birth year]  Visit Date: 05/08/2019  PCP: Ria Bush, MD  Referred by: Ria Bush, MD  Chief Complaint  Patient presents with  . Follow-up    Right Knee    This visit occurred during the SARS-CoV-2 public health emergency.  Safety protocols were in place, including screening questions prior to the visit, additional usage of staff PPE, and extensive cleaning of exam room while observing appropriate contact time as indicated for disinfecting solutions.   Subjective:   Gerald [name] is a 39 y.o. very pleasant male patient with Body mass index is 34.64 kg/m. who presents with the following:  This is a follow-up office visit from 3 weeks ago.  Previously I saw him on April 18, 2019.  At that point he was limping quite a bit and had some difficulty extending and flexing the knee.  That point I placed him into a patellar J brace.  Also gave him some oral Voltaren and some tramadol.  Thought that at that point that he may have had a meniscal tear medially.  Today his pain is more proximal and around the area of the medial femoral condyle.  He is still limping quite a bit and having trouble doing basic movements moving around.  Not doing well.  Still pretty uncomfortable.  Moves a little better then jumps back.    Review of Systems is noted in the HPI, as appropriate   Objective:   BP 100/76   Pulse 90   Temp 97.7 F (36.5 C) (Temporal)   Ht 5' 7.5" (1.715 m)   Wt 224 lb 8 oz (101.8 kg)   SpO2 97%   BMI 34.64 kg/m   GEN: No acute distress; alert,appropriate. PULM: Breathing comfortably in no respiratory distress PSYCH: Normally interactive.    Right knee: Lacks 2 degrees of extension and flexion is to 105 degrees.   Mild effusion.  Nontender at the patellar facets.  MCL, LCL, ACL, and PCL are all intact.  He does have some notable tenderness at the medial femoral condyle.  Bounce home is negative.  Flexion pinch is quite painful.  McMurray's is quite painful without mechanical symptoms.  Neurovascularly intact.  Hip range of motion is full and does not cause any pain  Radiology: DG Tibia/Fibula Right  Result Date: 04/17/2019 CLINICAL DATA:  Pain and tenderness after fall EXAM: RIGHT TIBIA AND FIBULA - 2 VIEW COMPARISON:  None. FINDINGS: Frontal and lateral views of the right tibia and fibula are obtained. There are no acute displaced fractures. Alignment of the right knee and ankle is anatomic. Soft tissues are unremarkable. IMPRESSION: 1. Unremarkable right tibia and fibula. Electronically Signed   By: Randa Ngo M.D.   On: 04/17/2019 18:35   DG Knee Complete 4 Views Right  Result Date: 05/08/2019 CLINICAL DATA:  Pain at distal medial femur EXAM: RIGHT KNEE - COMPLETE 4+ VIEW COMPARISON:  None. FINDINGS: No evidence of fracture, dislocation, or joint effusion. No evidence of arthropathy or other focal bone abnormality. Soft tissues are unremarkable. IMPRESSION: Negative. Electronically Signed   By: Donavan Foil M.D.   On: 05/08/2019 23:54    Assessment and Plan:     ICD-10-CM   1. Acute pain of right knee  M25.561 DG  Knee Complete 4 Views Right    MR Knee Right Wo Contrast  2. Pain in right femur  M89.8X5 DG Knee Complete 4 Views Right    MR Knee Right Wo Contrast   Nondiagnostic x-rays of the x-ray of his right knee as well as tib-fib films.  Obtain an MRI of the right knee without contrast.  Concern for pain at the medial femoral condyle not in the joint space.  Insufficiency fracture or occult fracture cannot be excluded.  Meniscal tear is possible but is clinically less likely.  MCL sprain brain is also possible but clinically less likely.  Further plan of care will be dictated by the  patient's MRI  Follow-up: No follow-ups on file.  No orders of the defined types were placed in this encounter.  Medications Discontinued During This Encounter  Medication Reason  . fluticasone (FLONASE) 50 MCG/ACT nasal spray Duplicate   Orders Placed This Encounter  Procedures  . DG Knee Complete 4 Views Right  . MR Knee Right Wo Contrast    Signed,  Cienna Dumais T. Arby Dahir, MD   Outpatient Encounter Medications as of 05/08/2019  Medication Sig  . acetaminophen (TYLENOL) 500 MG tablet Take 500 mg by mouth every 6 (six) hours as needed for pain.  Marland Kitchen atomoxetine (STRATTERA) 80 MG capsule Take 1 capsule (80 mg total) by mouth daily.  . cyclobenzaprine (FLEXERIL) 10 MG tablet Take 1 tablet (10 mg total) by mouth 2 (two) times daily as needed for muscle spasms.  . diclofenac (VOLTAREN) 75 MG EC tablet Take 1 tablet (75 mg total) by mouth 2 (two) times daily.  . fluticasone (FLONASE) 50 MCG/ACT nasal spray Place 2 sprays into both nostrils daily as needed for allergies or rhinitis.  Marland Kitchen loratadine (CLARITIN) 10 MG tablet Take 10 mg by mouth as needed.    . Multiple Vitamin (MULTIVITAMIN) tablet Take 1 tablet by mouth daily.  . pseudoephedrine (SUDAFED) 30 MG tablet Take 30 mg by mouth every 4 (four) hours as needed for congestion.  . traMADol (ULTRAM) 50 MG tablet Take 50 mg by mouth every 6 (six) hours as needed.  . [DISCONTINUED] fluticasone (FLONASE) 50 MCG/ACT nasal spray Place 2 sprays into both nostrils daily. (Patient taking differently: Place 2 sprays into both nostrils daily. As needed)  . ibuprofen (ADVIL) 800 MG tablet Take 1 tablet (800 mg total) by mouth every 8 (eight) hours as needed for moderate pain. (Patient not taking: Reported on 05/08/2019)   No facility-administered encounter medications on file as of 05/08/2019.

## 2019-05-19 ENCOUNTER — Other Ambulatory Visit: Payer: Self-pay

## 2019-05-19 ENCOUNTER — Ambulatory Visit
Admission: RE | Admit: 2019-05-19 | Discharge: 2019-05-19 | Disposition: A | Discharge status: Home / Self Care | Payer: Managed Care, Other (non HMO) | Source: Ambulatory Visit | Attending: Family Medicine | Admitting: Family Medicine

## 2019-05-19 DIAGNOSIS — M898X5 Other specified disorders of bone, thigh: Secondary | ICD-10-CM

## 2019-05-19 DIAGNOSIS — M25561 Pain in right knee: Secondary | ICD-10-CM

## 2019-05-21 ENCOUNTER — Telehealth: Payer: Self-pay | Admitting: Family Medicine

## 2019-05-21 NOTE — Telephone Encounter (Signed)
Pt called stating he could see mri results through mychart. But wanted to know what Dr Patsy Lager says about mri and what he recommends

## 2019-05-22 ENCOUNTER — Other Ambulatory Visit: Payer: Self-pay | Admitting: Family Medicine

## 2019-05-22 DIAGNOSIS — M2391 Unspecified internal derangement of right knee: Secondary | ICD-10-CM

## 2019-05-22 DIAGNOSIS — M25561 Pain in right knee: Secondary | ICD-10-CM

## 2019-05-22 DIAGNOSIS — R937 Abnormal findings on diagnostic imaging of other parts of musculoskeletal system: Secondary | ICD-10-CM

## 2019-05-22 NOTE — Telephone Encounter (Signed)
We spoke on phone, conservative care.  Steroid injection, ROM now, brace, f/u Monday, start PT

## 2019-05-22 NOTE — Telephone Encounter (Signed)
Pt calling to speak with Dr Patsy Lager about MRI results; pt cannot see any improvement in rt knee since injury 5 wks ago. pts discomfort level now is 4 but when knee grabs pts pain level goes to 12. Pt is still limping and request cb to understand continuation of care and what are the next steps.pt is anxious to hear from Dr Patsy Lager.

## 2019-05-27 ENCOUNTER — Other Ambulatory Visit: Payer: Self-pay

## 2019-05-27 ENCOUNTER — Encounter: Payer: Self-pay | Admitting: Family Medicine

## 2019-05-27 ENCOUNTER — Ambulatory Visit: Payer: Managed Care, Other (non HMO) | Admitting: Family Medicine

## 2019-05-27 VITALS — BP 110/74 | HR 104 | Temp 98.8°F | Ht 67.5 in | Wt 224.0 lb

## 2019-05-27 DIAGNOSIS — R937 Abnormal findings on diagnostic imaging of other parts of musculoskeletal system: Secondary | ICD-10-CM | POA: Diagnosis not present

## 2019-05-27 NOTE — Progress Notes (Signed)
Gerald Ponder T. Mamoudou Mulvehill, MD Primary Care and Sports Medicine [facility] at [facility] [place], [address] Phone: [phone]  FAX: [phone]  Gerald [name] - 39 y.o. male  MRN [id number]  Date of Birth: Oct 30, 1980  Visit Date: 05/27/2019  PCP: Ria Bush, MD  Referred by: Ria Bush, MD  Chief Complaint  Patient presents with  . Follow-up    Right Knee    This visit occurred during the SARS-CoV-2 public health emergency.  Safety protocols were in place, including screening questions prior to the visit, additional usage of staff PPE, and extensive cleaning of exam room while observing appropriate contact time as indicated for disinfecting solutions.   Subjective:   Gerald [name] is a 39 y.o. very pleasant male patient with Body mass index is 34.57 kg/m. who presents with the following:  Knee is doing better.  I have seen him a number of times regarding his right knee.  Last couple of times I examined him he was quite a bit decreased range of motion only 105 degrees of flexion.  When I first saw him he was barely able to ambulate.  His knee pain progressed and he was not really doing any better the second time that I saw him.  Ultimately i got an MRI of his right knee.  This showed a focal hyaline cartilage defect at the patellofemoral joint as well as some bone edema around this area.  I pulled up his MRI and reviewed this myself independently with him.  At the patella there is a focal cartilage defect and a small amount of bone edema as well.  All ligamentous structures are intact.  Menisci appear to be intact as well.  Review of Systems is noted in the HPI, as appropriate   Objective:   BP 110/74   Pulse (!) 104   Temp 98.8 F (37.1 C) (Temporal)   Ht 5' 7.5" (1.715 m)   Wt 224 lb (101.6 kg)   SpO2 95%   BMI 34.57 kg/m   GEN: No acute distress; alert,appropriate. PULM: Breathing comfortably in no respiratory  distress PSYCH: Normally interactive.   Extension to 0 flexion to 125.  Nontender at the medial lateral joint lines.  Forced flexion and extension cause no pain.  Mild pain with patellar compression.  All ligamentous structures are intact.  Radiology: MR Knee Right Wo Contrast  Result Date: 05/19/2019 CLINICAL DATA:  Acute onset medial right knee pain when the patient stood up from a couch 1 month ago. EXAM: MRI OF THE RIGHT KNEE WITHOUT CONTRAST TECHNIQUE: Multiplanar, multisequence MR imaging of the knee was performed. No intravenous contrast was administered. COMPARISON:  Plain films right knee 05/08/2019. FINDINGS: MENISCI Medial meniscus:  Intact. Lateral meniscus:  Intact. LIGAMENTS Cruciates:  Intact. Collaterals:  Intact. CARTILAGE Patellofemoral: There is a focal fissure in cartilage at the patellar apex in the lower pole measuring approximately 0.2 cm in diameter. A punctate focus of underlying subchondral edema is seen. Medial:  Preserved. Lateral:  Preserved. Joint:  Trace amount of joint fluid. Popliteal Fossa:  No Baker's cyst. Extensor Mechanism:  Intact. Bones:  No fracture, stress change or worrisome lesion. Other: None. IMPRESSION: Negative for meniscal or ligament tear.  No internal derangement. Punctate defect in hyaline cartilage at the apex of the patella with a tiny focus of underlying subchondral edema. Cartilage surfaces are otherwise preserved. Electronically Signed   By: Inge Rise M.D.   On: 05/19/2019 15:12  DG Knee Complete 4 Views Right  Result Date: 05/08/2019 CLINICAL DATA:  Pain at distal medial femur EXAM: RIGHT KNEE - COMPLETE 4+ VIEW COMPARISON:  None. FINDINGS: No evidence of fracture, dislocation, or joint effusion. No evidence of arthropathy or other focal bone abnormality. Soft tissues are unremarkable. IMPRESSION: Negative. Electronically Signed   By: Jasmine Pang M.D.   On: 05/08/2019 23:54    Assessment and Plan:     ICD-10-CM   1. Bone marrow  edema  R93.7    Really is doing much better.  Hyalin cartilage defect.  He has started some physical therapy and is going to do a few visits of this.  Overall he is doing quite a bit better.  Initially I was thinking about injecting his knee with some steroids, but I am to hold off on this.  Follow-up as needed  Signed,  Karleen Hampshire T. Elfreida Heggs, MD   Outpatient Encounter Medications as of 05/27/2019  Medication Sig  . acetaminophen (TYLENOL) 500 MG tablet Take 500 mg by mouth every 6 (six) hours as needed for pain.  Marland Kitchen atomoxetine (STRATTERA) 80 MG capsule Take 1 capsule (80 mg total) by mouth daily.  . cyclobenzaprine (FLEXERIL) 10 MG tablet Take 1 tablet (10 mg total) by mouth 2 (two) times daily as needed for muscle spasms.  . diclofenac (VOLTAREN) 75 MG EC tablet Take 1 tablet (75 mg total) by mouth 2 (two) times daily.  . fluticasone (FLONASE) 50 MCG/ACT nasal spray Place 2 sprays into both nostrils daily as needed for allergies or rhinitis.  Marland Kitchen ibuprofen (ADVIL) 800 MG tablet Take 1 tablet (800 mg total) by mouth every 8 (eight) hours as needed for moderate pain.  Marland Kitchen loratadine (CLARITIN) 10 MG tablet Take 10 mg by mouth as needed.    . Multiple Vitamin (MULTIVITAMIN) tablet Take 1 tablet by mouth daily.  . pseudoephedrine (SUDAFED) 30 MG tablet Take 30 mg by mouth every 4 (four) hours as needed for congestion.  . traMADol (ULTRAM) 50 MG tablet Take 50 mg by mouth every 6 (six) hours as needed.   No facility-administered encounter medications on file as of 05/27/2019.

## 2019-06-08 ENCOUNTER — Ambulatory Visit: Payer: Managed Care, Other (non HMO) | Attending: Internal Medicine

## 2019-06-08 DIAGNOSIS — Z23 Encounter for immunization: Secondary | ICD-10-CM

## 2019-06-08 NOTE — Progress Notes (Signed)
   Covid-19 Vaccination Clinic  Name:  DANIEL RITTHALER    MRN: [id number] DOB: November 21, 1980  06/08/2019  Mr. Petrovich was observed post Covid-19 immunization for 15 minutes without incident. He was provided with Vaccine Information Sheet and instruction to access the V-Safe system.   Mr. Guardia was instructed to call 911 with any severe reactions post vaccine: Marland Kitchen Difficulty breathing  . Swelling of face and throat  . A fast heartbeat  . A bad rash all over body  . Dizziness and weakness   Immunizations Administered    Name Date Dose VIS Date Route   Pfizer COVID-19 Vaccine 06/08/2019  9:54 AM 0.3 mL 02/15/2019 Intramuscular   Manufacturer: [employer], [employer]   Lot: [id number]   NDC: [address]-8

## 2019-07-02 ENCOUNTER — Ambulatory Visit: Payer: Managed Care, Other (non HMO) | Attending: Internal Medicine

## 2019-07-02 DIAGNOSIS — Z23 Encounter for immunization: Secondary | ICD-10-CM

## 2019-07-02 NOTE — Progress Notes (Signed)
   Covid-19 Vaccination Clinic  Name:  Gerald [name]    MRN: [id number] DOB: 02-20-1981  07/02/2019  Gerald [name] was observed post Covid-19 immunization for 15 minutes without incident. He was provided with Vaccine Information Sheet and instruction to access the V-Safe system.   Gerald [name] was instructed to call 911 with any severe reactions post vaccine: Marland Kitchen Difficulty breathing  . Swelling of face and throat  . A fast heartbeat  . A bad rash all over body  . Dizziness and weakness   Immunizations Administered    Name Date Dose VIS Date Route   Pfizer COVID-19 Vaccine 07/02/2019  3:59 PM 0.3 mL 05/01/2018 Intramuscular   Manufacturer: [employer], [employer]   Lot: [id number]   NDC: [address]-7

## 2019-07-12 ENCOUNTER — Other Ambulatory Visit: Payer: Self-pay | Admitting: Family Medicine

## 2019-07-12 ENCOUNTER — Telehealth: Payer: Self-pay | Admitting: Family Medicine

## 2019-07-12 NOTE — Telephone Encounter (Signed)
Called patient and got him scheduled for CPE and Lab. °

## 2019-07-12 NOTE — Telephone Encounter (Signed)
E-scribed refill.  Plz schedule CPE and lab visits. 

## 2019-07-12 NOTE — Telephone Encounter (Signed)
Noted  

## 2019-07-22 ENCOUNTER — Encounter: Payer: Self-pay | Admitting: Family Medicine

## 2019-08-06 ENCOUNTER — Other Ambulatory Visit: Payer: Self-pay | Admitting: Family Medicine

## 2019-08-06 DIAGNOSIS — E785 Hyperlipidemia, unspecified: Secondary | ICD-10-CM

## 2019-08-06 DIAGNOSIS — E041 Nontoxic single thyroid nodule: Secondary | ICD-10-CM

## 2019-08-07 ENCOUNTER — Other Ambulatory Visit (INDEPENDENT_AMBULATORY_CARE_PROVIDER_SITE_OTHER): Payer: Managed Care, Other (non HMO)

## 2019-08-07 DIAGNOSIS — E785 Hyperlipidemia, unspecified: Secondary | ICD-10-CM

## 2019-08-07 DIAGNOSIS — E041 Nontoxic single thyroid nodule: Secondary | ICD-10-CM

## 2019-08-07 NOTE — Addendum Note (Signed)
Addended by: Alvina Chou on: 08/07/2019 07:47 AM   Modules accepted: Orders

## 2019-08-08 LAB — COMPREHENSIVE METABOLIC PANEL
ALT: 48 IU/L — ABNORMAL HIGH (ref 0–44)
AST: 21 IU/L (ref 0–40)
Albumin/Globulin Ratio: 1.7 (ref 1.2–2.2)
Albumin: 4.2 g/dL (ref 4.0–5.0)
Alkaline Phosphatase: 82 IU/L (ref 48–121)
BUN/Creatinine Ratio: 12 (ref 9–20)
BUN: 12 mg/dL (ref 6–24)
Bilirubin Total: 0.9 mg/dL (ref 0.0–1.2)
CO2: 23 mmol/L (ref 20–29)
Calcium: 9.3 mg/dL (ref 8.7–10.2)
Chloride: 102 mmol/L (ref 96–106)
Creatinine, Ser: 1.01 mg/dL (ref 0.76–1.27)
GFR calc Af Amer: 107 mL/min/{1.73_m2} (ref >59)
GFR calc non Af Amer: 93 mL/min/{1.73_m2} (ref >59)
Globulin, Total: 2.5 g/dL (ref 1.5–4.5)
Glucose: 106 mg/dL — ABNORMAL HIGH (ref 65–99)
Potassium: 4.4 mmol/L (ref 3.5–5.2)
Sodium: 139 mmol/L (ref 134–144)
Total Protein: 6.7 g/dL (ref 6.0–8.5)

## 2019-08-08 LAB — LIPID PANEL
Chol/HDL Ratio: 4.9 ratio (ref 0.0–5.0)
Cholesterol, Total: 183 mg/dL (ref 100–199)
HDL: 37 mg/dL — ABNORMAL LOW (ref >39)
LDL Chol Calc (NIH): 122 mg/dL — ABNORMAL HIGH (ref 0–99)
Triglycerides: 132 mg/dL (ref 0–149)
VLDL Cholesterol Cal: 24 mg/dL (ref 5–40)

## 2019-08-08 LAB — TSH: TSH: 2.61 u[IU]/mL (ref 0.450–4.500)

## 2019-08-13 ENCOUNTER — Other Ambulatory Visit: Payer: Self-pay

## 2019-08-13 ENCOUNTER — Encounter: Payer: Self-pay | Admitting: Family Medicine

## 2019-08-13 ENCOUNTER — Ambulatory Visit (INDEPENDENT_AMBULATORY_CARE_PROVIDER_SITE_OTHER): Payer: Managed Care, Other (non HMO) | Admitting: Family Medicine

## 2019-08-13 VITALS — BP 110/80 | HR 87 | Temp 98.2°F | Ht 67.5 in | Wt 226.1 lb

## 2019-08-13 DIAGNOSIS — Z0000 Encounter for general adult medical examination without abnormal findings: Secondary | ICD-10-CM | POA: Diagnosis not present

## 2019-08-13 DIAGNOSIS — E785 Hyperlipidemia, unspecified: Secondary | ICD-10-CM | POA: Diagnosis not present

## 2019-08-13 DIAGNOSIS — R7303 Prediabetes: Secondary | ICD-10-CM | POA: Insufficient documentation

## 2019-08-13 DIAGNOSIS — E669 Obesity, unspecified: Secondary | ICD-10-CM | POA: Diagnosis not present

## 2019-08-13 DIAGNOSIS — R739 Hyperglycemia, unspecified: Secondary | ICD-10-CM | POA: Diagnosis not present

## 2019-08-13 DIAGNOSIS — F900 Attention-deficit hyperactivity disorder, predominantly inattentive type: Secondary | ICD-10-CM

## 2019-08-13 LAB — POCT GLYCOSYLATED HEMOGLOBIN (HGB A1C): Hemoglobin A1C: 5.8 % — AB (ref 4.0–5.6)

## 2019-08-13 MED ORDER — ATOMOXETINE HCL 80 MG PO CAPS: 80.0000 mg | ORAL_CAPSULE | Freq: Every day | ORAL | 3 refills | Status: DC

## 2019-08-13 MED ORDER — FLUTICASONE PROPIONATE 50 MCG/ACT NA SUSP: 2.0000 | Freq: Every day | NASAL | 6 refills | Status: DC | PRN

## 2019-08-13 NOTE — Assessment & Plan Note (Signed)
Reviewed recent A1c. Encouraged healthy low sugar low carb diet and weight loss for glycemic control

## 2019-08-13 NOTE — Assessment & Plan Note (Signed)
Encouraged healthy diet and lifestyle changes to affect sustainable weight loss.  

## 2019-08-13 NOTE — Assessment & Plan Note (Signed)
Stable period on strattera 80mg  daily - will continue this.

## 2019-08-13 NOTE — Assessment & Plan Note (Signed)
Encouraged low chol diet - and aerobic exercise to keep lipids under control

## 2019-08-13 NOTE — Patient Instructions (Signed)
A1c today.  You are doing well today Work on exercise routine and healthy diet choices.  Return as needed or in 1 year for next physical.   Health Maintenance, Male Adopting a healthy lifestyle and getting preventive care are important in promoting health and wellness. Ask your health care provider about:  The right schedule for you to have regular tests and exams.  Things you can do on your own to prevent diseases and keep yourself healthy. What should I know about diet, weight, and exercise? Eat a healthy diet   Eat a diet that includes plenty of vegetables, fruits, low-fat dairy products, and lean protein.  Do not eat a lot of foods that are high in solid fats, added sugars, or sodium. Maintain a healthy weight Body mass index (BMI) is a measurement that can be used to identify possible weight problems. It estimates body fat based on height and weight. Your health care provider can help determine your BMI and help you achieve or maintain a healthy weight. Get regular exercise Get regular exercise. This is one of the most important things you can do for your health. Most adults should:  Exercise for at least 150 minutes each week. The exercise should increase your heart rate and make you sweat (moderate-intensity exercise).  Do strengthening exercises at least twice a week. This is in addition to the moderate-intensity exercise.  Spend less time sitting. Even light physical activity can be beneficial. Watch cholesterol and blood lipids Have your blood tested for lipids and cholesterol at 41 years of age, then have this test every 5 years. You may need to have your cholesterol levels checked more often if:  Your lipid or cholesterol levels are high.  You are older than 41 years of age.  You are at high risk for heart disease. What should I know about cancer screening? Many types of cancers can be detected early and may often be prevented. Depending on your health history and  family history, you may need to have cancer screening at various ages. This may include screening for:  Colorectal cancer.  Prostate cancer.  Skin cancer.  Lung cancer. What should I know about heart disease, diabetes, and high blood pressure? Blood pressure and heart disease  High blood pressure causes heart disease and increases the risk of stroke. This is more likely to develop in people who have high blood pressure readings, are of African descent, or are overweight.  Talk with your health care provider about your target blood pressure readings.  Have your blood pressure checked: ? Every 3-5 years if you are 60-36 years of age. ? Every year if you are 71 years old or older.  If you are between the ages of 44 and 21 and are a current or former smoker, ask your health care provider if you should have a one-time screening for abdominal aortic aneurysm (AAA). Diabetes Have regular diabetes screenings. This checks your fasting blood sugar level. Have the screening done:  Once every three years after age 48 if you are at a normal weight and have a low risk for diabetes.  More often and at a younger age if you are overweight or have a high risk for diabetes. What should I know about preventing infection? Hepatitis B If you have a higher risk for hepatitis B, you should be screened for this virus. Talk with your health care provider to find out if you are at risk for hepatitis B infection. Hepatitis C Blood testing is  recommended for:  Everyone born from [birth year] through 1967.  Anyone with known risk factors for hepatitis C. Sexually transmitted infections (STIs)  You should be screened each year for STIs, including gonorrhea and chlamydia, if: ? You are sexually active and are younger than 41 years of age. ? You are older than 41 years of age and your health care provider tells you that you are at risk for this type of infection. ? Your sexual activity has changed since you were  last screened, and you are at increased risk for chlamydia or gonorrhea. Ask your health care provider if you are at risk.  Ask your health care provider about whether you are at high risk for HIV. Your health care provider may recommend a prescription medicine to help prevent HIV infection. If you choose to take medicine to prevent HIV, you should first get tested for HIV. You should then be tested every 3 months for as long as you are taking the medicine. Follow these instructions at home: Lifestyle  Do not use any products that contain nicotine or tobacco, such as cigarettes, e-cigarettes, and chewing tobacco. If you need help quitting, ask your health care provider.  Do not use street drugs.  Do not share needles.  Ask your health care provider for help if you need support or information about quitting drugs. Alcohol use  Do not drink alcohol if your health care provider tells you not to drink.  If you drink alcohol: ? Limit how much you have to 0-2 drinks a day. ? Be aware of how much alcohol is in your drink. In the [place] one drink equals one 12 oz bottle of beer (355 mL), one 5 oz glass of wine (148 mL), or one 1 oz glass of hard liquor (44 mL). General instructions  Schedule regular health, dental, and eye exams.  Stay current with your vaccines.  Tell your health care provider if: ? You often feel depressed. ? You have ever been abused or do not feel safe at home. Summary  Adopting a healthy lifestyle and getting preventive care are important in promoting health and wellness.  Follow your health care provider's instructions about healthy diet, exercising, and getting tested or screened for diseases.  Follow your health care provider's instructions on monitoring your cholesterol and blood pressure. This information is not intended to replace advice given to you by your health care provider. Make sure you discuss any questions you have with your health care  provider. Document Revised: 02/14/2018 Document Reviewed: 02/14/2018 Elsevier Patient Education  2020 [employer].

## 2019-08-13 NOTE — Progress Notes (Signed)
This visit was conducted in person.  BP 110/80 (BP Location: Left Arm, Patient Position: Sitting, Cuff Size: Large)   Pulse 87   Temp 98.2 F (36.8 C) (Temporal)   Ht 5' 7.5" (1.715 m)   Wt 226 lb 2 oz (102.6 kg)   SpO2 99%   BMI 34.89 kg/m    CC: CPE Subjective:    Patient ID: Gerald [name], male    DOB: 08-01-1980, 39 y.o.   MRN: [id number]  HPI: Gerald [name] is a 39 y.o. male presenting on 08/13/2019 for Annual Exam   Tore cartilage to R knee saw sports med with rehab - now finally back on golf course.   ADHD - on strattera 80mg  daily, tolerating well.   Preventative: Flu shot - declines  Td - 1995, 03/2010  COVID series - completed Pfizer vaccine 06/2019  Seat belt use discussed.  Sunscreen use discussed. No changing moles.  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 Interim medical history since our last visit reviewed. Allergies and medications reviewed and updated. Outpatient Medications Prior to Visit  Medication Sig Dispense Refill  . acetaminophen (TYLENOL) 500 MG tablet Take 500 mg by mouth every 6 (six) hours as needed for pain.    Marland Kitchen ibuprofen (ADVIL) 800 MG tablet Take 1 tablet (800 mg total) by mouth every 8 (eight) hours as needed for moderate pain. 21 tablet 0  . loratadine (CLARITIN) 10 MG tablet Take 10 mg by mouth as needed.      . Multiple Vitamin (MULTIVITAMIN) tablet Take 1 tablet by mouth daily.    . pseudoephedrine (SUDAFED) 30 MG tablet Take 30 mg by mouth every 4 (four) hours as needed for congestion.    Marland Kitchen atomoxetine (STRATTERA) 80 MG capsule TAKE 1 CAPSULE BY MOUTH EVERY DAY 30 capsule 0  . fluticasone (FLONASE) 50  MCG/ACT nasal spray Place 2 sprays into both nostrils daily as needed for allergies or rhinitis.    . cyclobenzaprine (FLEXERIL) 10 MG tablet Take 1 tablet (10 mg total) by mouth 2 (two) times daily as needed for muscle spasms. 20 tablet 0  . diclofenac (VOLTAREN) 75 MG EC tablet Take 1 tablet (75 mg total) by mouth 2 (two) times daily. 60 tablet 3  . traMADol (ULTRAM) 50 MG tablet Take 50 mg by mouth every 6 (six) hours as needed.     No facility-administered medications prior to visit.     Per HPI unless specifically indicated in ROS section below Review of Systems  Constitutional: Negative for activity change, appetite change, chills, fatigue, fever and unexpected weight change.  HENT: Negative for hearing loss.   Eyes: Negative for visual disturbance.  Respiratory: Negative for cough, chest tightness, shortness of breath and wheezing.   Cardiovascular: Negative for chest pain, palpitations and leg swelling.  Gastrointestinal: Negative for abdominal distention, abdominal pain, blood in stool, constipation, diarrhea, nausea and vomiting.  Genitourinary: Negative for difficulty urinating and hematuria.  Musculoskeletal: Negative for arthralgias, myalgias and neck pain.  Skin: Negative for rash.  Neurological: Negative for dizziness, seizures, syncope and headaches.  Hematological: Negative for adenopathy. Does not bruise/bleed easily.  Psychiatric/Behavioral: Negative  for dysphoric mood. The patient is not nervous/anxious.    Objective:  BP 110/80 (BP Location: Left Arm, Patient Position: Sitting, Cuff Size: Large)   Pulse 87   Temp 98.2 F (36.8 C) (Temporal)   Ht 5' 7.5" (1.715 m)   Wt 226 lb 2 oz (102.6 kg)   SpO2 99%   BMI 34.89 kg/m   Wt Readings from Last 3 Encounters:  08/13/19 226 lb 2 oz (102.6 kg)  05/27/19 224 lb (101.6 kg)  05/08/19 224 lb 8 oz (101.8 kg)      Physical Exam Vitals and nursing note reviewed.  Constitutional:      General: He is not in acute  distress.    Appearance: Normal appearance. He is well-developed. He is not ill-appearing.  HENT:     Head: Normocephalic and atraumatic.     Right Ear: Hearing, tympanic membrane, ear canal and external ear normal.     Left Ear: Hearing, tympanic membrane, ear canal and external ear normal.  Eyes:     General: No scleral icterus.    Extraocular Movements: Extraocular movements intact.     Conjunctiva/sclera: Conjunctivae normal.     Pupils: Pupils are equal, round, and reactive to light.  Cardiovascular:     Rate and Rhythm: Normal rate and regular rhythm.     Pulses: Normal pulses.          Radial pulses are 2+ on the right side and 2+ on the left side.     Heart sounds: Normal heart sounds. No murmur.  Pulmonary:     Effort: Pulmonary effort is normal. No respiratory distress.     Breath sounds: Normal breath sounds. No wheezing, rhonchi or rales.  Abdominal:     General: Abdomen is flat. Bowel sounds are normal. There is no distension.     Palpations: Abdomen is soft. There is no mass.     Tenderness: There is no abdominal tenderness. There is no guarding or rebound.     Hernia: No hernia is present.  Musculoskeletal:        General: Normal range of motion.     Cervical back: Normal range of motion and neck supple.     Right lower leg: No edema.     Left lower leg: No edema.  Lymphadenopathy:     Cervical: No cervical adenopathy.  Skin:    General: Skin is warm and dry.     Findings: No rash.  Neurological:     General: No focal deficit present.     Mental Status: He is alert and oriented to person, place, and time.     Comments: CN grossly intact, station and gait intact  Psychiatric:        Mood and Affect: Mood normal.        Behavior: Behavior normal.        Thought Content: Thought content normal.        Judgment: Judgment normal.       Results for orders placed or performed in visit on 08/13/19  POCT glycosylated hemoglobin (Hb A1C)  Result Value Ref Range    Hemoglobin A1C 5.8 (A) 4.0 - 5.6 %   HbA1c POC (<> result, manual entry)     HbA1c, POC (prediabetic range)     HbA1c, POC (controlled diabetic range)     Assessment & Plan:  This visit occurred during the SARS-CoV-2 public health emergency.  Safety protocols were in place, including screening questions prior to the visit, additional  usage of staff PPE, and extensive cleaning of exam room while observing appropriate contact time as indicated for disinfecting solutions.   Problem List Items Addressed This Visit    Prediabetes    Reviewed recent A1c. Encouraged healthy low sugar low carb diet and weight loss for glycemic control       Obesity, Class I, BMI 30-34.9    Encouraged healthy diet and lifestyle changes to affect sustainable weight loss.       Health maintenance examination - Primary    Preventative protocols reviewed and updated unless pt declined. Discussed healthy diet and lifestyle.       Dyslipidemia    Encouraged low chol diet - and aerobic exercise to keep lipids under control       ADHD (attention deficit hyperactivity disorder), inattentive type    Stable period on strattera 80mg  daily - will continue this.        Other Visit Diagnoses    Hyperglycemia       Relevant Orders   POCT glycosylated hemoglobin (Hb A1C) (Completed)       Meds ordered this encounter  Medications  . atomoxetine (STRATTERA) 80 MG capsule    Sig: Take 1 capsule (80 mg total) by mouth daily.    Dispense:  90 capsule    Refill:  3  . fluticasone (FLONASE) 50 MCG/ACT nasal spray    Sig: Place 2 sprays into both nostrils daily as needed for allergies or rhinitis.    Dispense:  16 g    Refill:  6   Orders Placed This Encounter  Procedures  . POCT glycosylated hemoglobin (Hb A1C)    Patient instructions: A1c today.  You are doing well today Work on exercise routine and healthy diet choices.  Return as needed or in 1 year for next physical.   Follow up plan: Return in about  1 year (around 08/12/2020) for annual exam, prior fasting for blood work.  10/12/2020, MD

## 2019-08-13 NOTE — Assessment & Plan Note (Signed)
Preventative protocols reviewed and updated unless pt declined. Discussed healthy diet and lifestyle.  

## 2020-03-31 ENCOUNTER — Encounter: Payer: Self-pay | Admitting: Family Medicine

## 2020-08-25 ENCOUNTER — Telehealth: Payer: Self-pay | Admitting: Family Medicine

## 2020-08-25 NOTE — Telephone Encounter (Signed)
E-scribed refill.  Plz schedule lab and cpe visits.  

## 2020-08-26 ENCOUNTER — Telehealth: Payer: Managed Care, Other (non HMO) | Admitting: Medical

## 2020-08-26 ENCOUNTER — Other Ambulatory Visit: Payer: Self-pay

## 2020-08-26 DIAGNOSIS — U071 COVID-19: Secondary | ICD-10-CM | POA: Diagnosis not present

## 2020-08-26 MED ORDER — BENZONATATE 100 MG PO CAPS: 100.0000 mg | ORAL_CAPSULE | Freq: Three times a day (TID) | ORAL | 0 refills | Status: DC | PRN

## 2020-08-26 MED ORDER — NIRMATRELVIR/RITONAVIR (PAXLOVID)TABLET
3.0000 | ORAL_TABLET | Freq: Two times a day (BID) | ORAL | 0 refills | Status: AC
Start: 2020-08-26 — End: 2020-08-31

## 2020-08-26 NOTE — Patient Instructions (Signed)
Recent COVID infection.  Positive test recently and signs symptoms started on Monday.  Patient's had 2 COVID vaccines in the past.  Milder side symptoms presently.  Advised can take vitamin D over-the-counter 2000 to 4000 international units daily.  Zinc 50 mg daily.  Paxlovid over the prescription sent to patient's pharmacy.  Benefits versus risk discussed.  The best time to start med  is within 5 days of symptom onset.  Use Flonase for nasal congestion and sent in benzonatate for cough.  Check O2 sat daily.  Discussed appropriate/good readings for O2 sats.  If developing sinus pressure or chest congestion notify me and could prescribe antibiotic for secondary bacterial infections.  Recommended to quarantine.  Can retest in 5 to 7 days from this Monday and if negative stop quarantine.  Follow-up in 7 days or as needed.

## 2020-08-26 NOTE — Telephone Encounter (Signed)
Patient is scheduled EM 

## 2020-08-26 NOTE — Telephone Encounter (Signed)
Noted  

## 2020-08-26 NOTE — Progress Notes (Signed)
   Subjective:    Patient ID: Gerald [name], male    DOB: [birth year], 40 y.o.   MRN: [id number]  HPI  Virtual Visit via Video Note  I connected with Gerald [name] on 08/26/20 at  1:20 PM EDT by a video enabled telemedicine application and verified that I am speaking with the correct person using two identifiers.  Location: Patient: home Provider: office   I discussed the limitations of evaluation and management by telemedicine and the availability of in person appointments. The patient expressed understanding and agreed to proceed.  Pt did not check vitals.  History of Present Illness:  Pt states mild st, pnd, nasal congestion, low grade fever and minimal cough.   He got weak positive. Shows me by video the binax test.  Symptoms started on Monday.  Pt has been vaccinated twice against covid. Pt ha high antibody level against covid in the past.    Observations/Objective:  General-no acute distress, pleasant, oriented. Lungs- on inspection lungs appear unlabored. Neck- no tracheal deviation or jvd on inspection. Neuro- gross motor function appears intact.  Heent- sounds congested.   Assessment and Plan: Recent COVID infection.  Positive test recently and signs symptoms started on Monday.  Patient's had 2 COVID vaccines in the past.  Milder side symptoms presently.  Advised can take vitamin D over-the-counter 2000 to 4000 international units daily.  Zinc 50 mg daily.  Paxlovid over the prescription sent to patient's pharmacy.  Benefits versus risk discussed.  The best time to start med  is within 5 days of symptom onset.  Use Flonase for nasal congestion and sent in benzonatate for cough.  Check O2 sat daily.  Discussed appropriate/good readings for O2 sats.  If developing sinus pressure or chest congestion notify me and could prescribe antibiotic for secondary bacterial infections.  Recommended to quarantine.  Can retest in 5 to 7 days from this Monday and if  negative stop quarantine.  Follow-up in 7 days or as needed.  Esperanza Richters, PA-C   Time spent with patient today was  24 minutes which consisted of chart revdew, discussing diagnosis, work up treatment and documentation.   Follow Up Instructions:    I discussed the assessment and treatment plan with the patient. The patient was provided an opportunity to ask questions and all were answered. The patient agreed with the plan and demonstrated an understanding of the instructions.   The patient was advised to call back or seek an in-person evaluation if the symptoms worsen or if the condition fails to improve as anticipated.  Time spent with patient today was 25 minutes which consisted of chart revdiew, discussing diagnosis, work up treatment and documentation.    Esperanza Richters, PA-C    Review of Systems  Constitutional:  Negative for chills, fatigue and fever.  HENT:  Positive for congestion. Negative for facial swelling, mouth sores, sinus pressure and sinus pain.   Respiratory:  Positive for cough. Negative for chest tightness, shortness of breath and wheezing.   Cardiovascular:  Negative for chest pain and palpitations.  Gastrointestinal:  Negative for abdominal pain.  Musculoskeletal:  Negative for back pain.  Neurological:  Negative for dizziness, seizures, numbness and headaches.  Hematological:  Negative for adenopathy. Does not bruise/bleed easily.  Psychiatric/Behavioral:  Negative for confusion. The patient is not nervous/anxious.       Objective:   Physical Exam        Assessment & Plan:

## 2020-11-24 ENCOUNTER — Other Ambulatory Visit: Payer: Managed Care, Other (non HMO)

## 2020-12-01 ENCOUNTER — Encounter: Payer: Self-pay | Admitting: Family Medicine

## 2020-12-01 ENCOUNTER — Ambulatory Visit (INDEPENDENT_AMBULATORY_CARE_PROVIDER_SITE_OTHER): Payer: Managed Care, Other (non HMO) | Admitting: Family Medicine

## 2020-12-01 VITALS — BP 120/82 | HR 75 | Temp 97.7°F | Ht 67.5 in | Wt 206.4 lb

## 2020-12-01 DIAGNOSIS — E669 Obesity, unspecified: Secondary | ICD-10-CM | POA: Diagnosis not present

## 2020-12-01 DIAGNOSIS — Z0000 Encounter for general adult medical examination without abnormal findings: Secondary | ICD-10-CM | POA: Diagnosis not present

## 2020-12-01 DIAGNOSIS — E66811 Obesity, class 1: Secondary | ICD-10-CM

## 2020-12-01 DIAGNOSIS — E785 Hyperlipidemia, unspecified: Secondary | ICD-10-CM | POA: Diagnosis not present

## 2020-12-01 DIAGNOSIS — R7303 Prediabetes: Secondary | ICD-10-CM

## 2020-12-01 DIAGNOSIS — F900 Attention-deficit hyperactivity disorder, predominantly inattentive type: Secondary | ICD-10-CM | POA: Diagnosis not present

## 2020-12-01 MED ORDER — ATOMOXETINE HCL 80 MG PO CAPS: 80.0000 mg | ORAL_CAPSULE | Freq: Every day | ORAL | 3 refills | Status: DC

## 2020-12-01 NOTE — Progress Notes (Signed)
Patient ID: Gerald [name], male    DOB: 1981-02-22, 40 y.o.   MRN: [id number]  This visit was conducted in person.  BP 120/82   Pulse 75   Temp 97.7 F (36.5 C) (Temporal)   Ht 5' 7.5" (1.715 m)   Wt 206 lb 7 oz (93.6 kg)   SpO2 100%   BMI 31.86 kg/m    CC: CPE Subjective:   HPI: Gerald [name] is a 40 y.o. male presenting on 12/01/2020 for Annual Exam (Needs labs sent to [facility]. )   COVID infection 08/2020 - symptoms fully resolved  ADHD - on strattera 80mg  daily, tolerating well.   More active recently - has lost 20 lbs since christmas! Has started door dashing.  Wants labs through .   Preventative: Flu shot - declines  COVID series - Pfizer 06/2019 x2, no booster  Td - 1995, 03/2010  Seat belt use discussed.  Sunscreen use discussed. No changing moles.  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 Interim medical history since our last visit reviewed. Allergies and medications reviewed and updated. Outpatient Medications Prior to Visit  Medication Sig Dispense Refill   acetaminophen (TYLENOL) 500 MG tablet Take 500 mg by mouth every 6 (six) hours as needed for pain.     fluticasone (FLONASE) 50 MCG/ACT nasal spray Place 2 sprays into both nostrils daily as needed for allergies or rhinitis. 16 g 6   loratadine (CLARITIN) 10 MG tablet Take 10 mg by mouth as needed.       Multiple Vitamin (MULTIVITAMIN) tablet Take 1 tablet by mouth daily.     pseudoephedrine (SUDAFED) 30 MG tablet Take 30 mg by mouth every 4 (four) hours as needed for congestion.     atomoxetine (STRATTERA) 80 MG capsule TAKE 1 CAPSULE BY  MOUTH EVERY DAY 90 capsule 0   benzonatate (TESSALON) 100 MG capsule Take 1 capsule (100 mg total) by mouth 3 (three) times daily as needed. 30 capsule 0   ibuprofen (ADVIL) 800 MG tablet Take 1 tablet (800 mg total) by mouth every 8 (eight) hours as needed for moderate pain. 21 tablet 0   No facility-administered medications prior to visit.     Per HPI unless specifically indicated in ROS section below Review of Systems  Constitutional:  Negative for activity change, appetite change, chills, fatigue, fever and unexpected weight change.  HENT:  Negative for hearing loss.   Eyes:  Negative for visual disturbance.  Respiratory:  Negative for cough, chest tightness, shortness of breath and wheezing.   Cardiovascular:  Negative for chest pain, palpitations and leg swelling.  Gastrointestinal:  Negative for abdominal distention, abdominal pain, blood in stool, constipation, diarrhea, nausea and vomiting.  Genitourinary:  Negative for difficulty urinating and hematuria.  Musculoskeletal:  Negative for arthralgias, myalgias and neck pain.  Skin:  Negative for rash.  Neurological:  Negative for dizziness, seizures, syncope and headaches.  Hematological:  Negative for adenopathy. Does not bruise/bleed easily.  Psychiatric/Behavioral:  Negative for dysphoric mood. The patient is not nervous/anxious.    Objective:  BP 120/82   Pulse 75   Temp 97.7  F (36.5 C) (Temporal)   Ht 5' 7.5" (1.715 m)   Wt 206 lb 7 oz (93.6 kg)   SpO2 100%   BMI 31.86 kg/m   Wt Readings from Last 3 Encounters:  12/01/20 206 lb 7 oz (93.6 kg)  08/13/19 226 lb 2 oz (102.6 kg)  05/27/19 224 lb (101.6 kg)      Physical Exam Vitals and nursing note reviewed.  Constitutional:      General: He is not in acute distress.    Appearance: Normal appearance. He is well-developed. He is not ill-appearing.  HENT:     Head: Normocephalic and atraumatic.     Right Ear: Hearing, tympanic membrane, ear canal and external ear  normal.     Left Ear: Hearing, tympanic membrane, ear canal and external ear normal.  Eyes:     General: No scleral icterus.    Extraocular Movements: Extraocular movements intact.     Conjunctiva/sclera: Conjunctivae normal.     Pupils: Pupils are equal, round, and reactive to light.  Neck:     Thyroid: No thyroid mass or thyromegaly.  Cardiovascular:     Rate and Rhythm: Normal rate and regular rhythm.     Pulses: Normal pulses.          Radial pulses are 2+ on the right side and 2+ on the left side.     Heart sounds: Normal heart sounds. No murmur heard. Pulmonary:     Effort: Pulmonary effort is normal. No respiratory distress.     Breath sounds: Normal breath sounds. No wheezing, rhonchi or rales.  Abdominal:     General: Bowel sounds are normal. There is no distension.     Palpations: Abdomen is soft. There is no mass.     Tenderness: There is no abdominal tenderness. There is no guarding or rebound.     Hernia: No hernia is present.  Musculoskeletal:        General: Normal range of motion.     Cervical back: Normal range of motion and neck supple.     Right lower leg: No edema.     Left lower leg: No edema.  Lymphadenopathy:     Cervical: No cervical adenopathy.  Skin:    General: Skin is warm and dry.     Findings: No rash.  Neurological:     General: No focal deficit present.     Mental Status: He is alert and oriented to person, place, and time.  Psychiatric:        Mood and Affect: Mood normal.        Behavior: Behavior normal.        Thought Content: Thought content normal.        Judgment: Judgment normal.      Results for orders placed or performed in visit on 08/13/19  POCT glycosylated hemoglobin (Hb A1C)  Result Value Ref Range   Hemoglobin A1C 5.8 (A) 4.0 - 5.6 %   HbA1c POC (<> result, manual entry)     HbA1c, POC (prediabetic range)     HbA1c, POC (controlled diabetic range)      Assessment & Plan:  This visit occurred during the SARS-CoV-2  public health emergency.  Safety protocols were in place, including screening questions prior to the visit, additional usage of staff PPE, and extensive cleaning of exam room while observing appropriate contact time as indicated for disinfecting solutions.   Problem List Items Addressed This Visit     Health maintenance examination -  Primary (Chronic)    Preventative protocols reviewed and updated unless pt declined. Discussed healthy diet and lifestyle.       Dyslipidemia    Chronic, off medication - update FLP today.  The 10-year ASCVD risk score (Arnett DK, et al., 2019) is: 1.4%   Values used to calculate the score:     Age: 30 years     Sex: Male     Is Non-Hispanic African American: No     Diabetic: No     Tobacco smoker: No     Systolic Blood Pressure: 120 mmHg     Is BP treated: No     HDL Cholesterol: 37 mg/dL     Total Cholesterol: 183 mg/dL       Relevant Orders   Lipid panel   Comprehensive metabolic panel   TSH   Obesity, Class I, BMI 30-34.9    Encouraged healthy diet and lifestyle choices to affect sustainable weight loss       ADHD (attention deficit hyperactivity disorder), inattentive type    Stable period on strattera 80mg  daily, tolerating well. Continue this.       Prediabetes    Update levels today.       Relevant Orders   Hemoglobin A1c     Meds ordered this encounter  Medications   atomoxetine (STRATTERA) 80 MG capsule    Sig: Take 1 capsule (80 mg total) by mouth daily.    Dispense:  90 capsule    Refill:  3   Orders Placed This Encounter  Procedures   Lipid panel   Comprehensive metabolic panel   Hemoglobin A1c   TSH     Patient instructions: Labs today  Congratulations on weight loss to date - keep up the good work! You are doing well today Return as needed or in 1 year for next physical   Follow up plan: Return in about 1 year (around 12/01/2021) for annual exam, prior fasting for blood work.  12/03/2021, MD

## 2020-12-01 NOTE — Assessment & Plan Note (Signed)
Stable period on strattera 80mg  daily, tolerating well. Continue this.

## 2020-12-01 NOTE — Assessment & Plan Note (Signed)
Update levels today.  

## 2020-12-01 NOTE — Assessment & Plan Note (Signed)
Encouraged healthy diet and lifestyle choices to affect sustainable weight loss.  ?

## 2020-12-01 NOTE — Patient Instructions (Addendum)
Labs today  Congratulations on weight loss to date - keep up the good work! You are doing well today Return as needed or in 1 year for next physical.   Health Maintenance, Male Adopting a healthy lifestyle and getting preventive care are important in promoting health and wellness. Ask your health care provider about: The right schedule for you to have regular tests and exams. Things you can do on your own to prevent diseases and keep yourself healthy. What should I know about diet, weight, and exercise? Eat a healthy diet  Eat a diet that includes plenty of vegetables, fruits, low-fat dairy products, and lean protein. Do not eat a lot of foods that are high in solid fats, added sugars, or sodium. Maintain a healthy weight Body mass index (BMI) is a measurement that can be used to identify possible weight problems. It estimates body fat based on height and weight. Your health care provider can help determine your BMI and help you achieve or maintain a healthy weight. Get regular exercise Get regular exercise. This is one of the most important things you can do for your health. Most adults should: Exercise for at least 150 minutes each week. The exercise should increase your heart rate and make you sweat (moderate-intensity exercise). Do strengthening exercises at least twice a week. This is in addition to the moderate-intensity exercise. Spend less time sitting. Even light physical activity can be beneficial. Watch cholesterol and blood lipids Have your blood tested for lipids and cholesterol at 42 years of age, then have this test every 5 years. You may need to have your cholesterol levels checked more often if: Your lipid or cholesterol levels are high. You are older than 42 years of age. You are at high risk for heart disease. What should I know about cancer screening? Many types of cancers can be detected early and may often be prevented. Depending on your health history and family  history, you may need to have cancer screening at various ages. This may include screening for: Colorectal cancer. Prostate cancer. Skin cancer. Lung cancer. What should I know about heart disease, diabetes, and high blood pressure? Blood pressure and heart disease High blood pressure causes heart disease and increases the risk of stroke. This is more likely to develop in people who have high blood pressure readings, are of African descent, or are overweight. Talk with your health care provider about your target blood pressure readings. Have your blood pressure checked: Every 3-5 years if you are 71-61 years of age. Every year if you are 68 years old or older. If you are between the ages of 89 and 64 and are a current or former smoker, ask your health care provider if you should have a one-time screening for abdominal aortic aneurysm (AAA). Diabetes Have regular diabetes screenings. This checks your fasting blood sugar level. Have the screening done: Once every three years after age 70 if you are at a normal weight and have a low risk for diabetes. More often and at a younger age if you are overweight or have a high risk for diabetes. What should I know about preventing infection? Hepatitis B If you have a higher risk for hepatitis B, you should be screened for this virus. Talk with your health care provider to find out if you are at risk for hepatitis B infection. Hepatitis C Blood testing is recommended for: Everyone born from [birth year] through 1965. Anyone with known risk factors for hepatitis C. Sexually transmitted infections (  STIs) You should be screened each year for STIs, including gonorrhea and chlamydia, if: You are sexually active and are younger than 42 years of age. You are older than 42 years of age and your health care provider tells you that you are at risk for this type of infection. Your sexual activity has changed since you were last screened, and you are at increased risk  for chlamydia or gonorrhea. Ask your health care provider if you are at risk. Ask your health care provider about whether you are at high risk for HIV. Your health care provider may recommend a prescription medicine to help prevent HIV infection. If you choose to take medicine to prevent HIV, you should first get tested for HIV. You should then be tested every 3 months for as long as you are taking the medicine. Follow these instructions at home: Lifestyle Do not use any products that contain nicotine or tobacco, such as cigarettes, e-cigarettes, and chewing tobacco. If you need help quitting, ask your health care provider. Do not use street drugs. Do not share needles. Ask your health care provider for help if you need support or information about quitting drugs. Alcohol use Do not drink alcohol if your health care provider tells you not to drink. If you drink alcohol: Limit how much you have to 0-2 drinks a day. Be aware of how much alcohol is in your drink. In the [place] one drink equals one 12 oz bottle of beer (355 mL), one 5 oz glass of wine (148 mL), or one 1 oz glass of hard liquor (44 mL). General instructions Schedule regular health, dental, and eye exams. Stay current with your vaccines. Tell your health care provider if: You often feel depressed. You have ever been abused or do not feel safe at home. Summary Adopting a healthy lifestyle and getting preventive care are important in promoting health and wellness. Follow your health care provider's instructions about healthy diet, exercising, and getting tested or screened for diseases. Follow your health care provider's instructions on monitoring your cholesterol and blood pressure. This information is not intended to replace advice given to you by your health care provider. Make sure you discuss any questions you have with your health care provider. Document Revised: 05/01/2020 Document Reviewed: 02/14/2018 Elsevier Patient  Education  2022 [employer].

## 2020-12-01 NOTE — Assessment & Plan Note (Signed)
Preventative protocols reviewed and updated unless pt declined. Discussed healthy diet and lifestyle.  

## 2020-12-01 NOTE — Assessment & Plan Note (Signed)
Chronic, off medication - update FLP today.  The 10-year ASCVD risk score (Arnett DK, et al., 2019) is: 1.4%   Values used to calculate the score:     Age: 40 years     Sex: Male     Is Non-Hispanic African American: No     Diabetic: No     Tobacco smoker: No     Systolic Blood Pressure: 120 mmHg     Is BP treated: No     HDL Cholesterol: 37 mg/dL     Total Cholesterol: 183 mg/dL

## 2020-12-02 LAB — COMPREHENSIVE METABOLIC PANEL
ALT: 30 IU/L (ref 0–44)
AST: 16 IU/L (ref 0–40)
Albumin/Globulin Ratio: 2 (ref 1.2–2.2)
Albumin: 4.7 g/dL (ref 4.0–5.0)
Alkaline Phosphatase: 88 IU/L (ref 44–121)
BUN/Creatinine Ratio: 9 (ref 9–20)
BUN: 8 mg/dL (ref 6–24)
Bilirubin Total: 0.9 mg/dL (ref 0.0–1.2)
CO2: 26 mmol/L (ref 20–29)
Calcium: 9.3 mg/dL (ref 8.7–10.2)
Chloride: 101 mmol/L (ref 96–106)
Creatinine, Ser: 0.91 mg/dL (ref 0.76–1.27)
Globulin, Total: 2.3 g/dL (ref 1.5–4.5)
Glucose: 84 mg/dL (ref 70–99)
Potassium: 4.4 mmol/L (ref 3.5–5.2)
Sodium: 140 mmol/L (ref 134–144)
Total Protein: 7 g/dL (ref 6.0–8.5)
eGFR: 109 mL/min/{1.73_m2} (ref >59)

## 2020-12-02 LAB — LIPID PANEL
Chol/HDL Ratio: 5.1 ratio — ABNORMAL HIGH (ref 0.0–5.0)
Cholesterol, Total: 178 mg/dL (ref 100–199)
HDL: 35 mg/dL — ABNORMAL LOW (ref >39)
LDL Chol Calc (NIH): 123 mg/dL — ABNORMAL HIGH (ref 0–99)
Triglycerides: 110 mg/dL (ref 0–149)
VLDL Cholesterol Cal: 20 mg/dL (ref 5–40)

## 2020-12-02 LAB — HEMOGLOBIN A1C
Est. average glucose Bld gHb Est-mCnc: 126 mg/dL
Hgb A1c MFr Bld: 6 % — ABNORMAL HIGH (ref 4.8–5.6)

## 2020-12-02 LAB — TSH: TSH: 3.68 u[IU]/mL (ref 0.450–4.500)

## 2021-08-16 ENCOUNTER — Telehealth: Payer: Managed Care, Other (non HMO) | Admitting: Physician Assistant

## 2021-08-16 DIAGNOSIS — U071 COVID-19: Secondary | ICD-10-CM

## 2021-08-16 MED ORDER — NIRMATRELVIR/RITONAVIR (PAXLOVID)TABLET: 3.0000 | ORAL_TABLET | Freq: Two times a day (BID) | ORAL | 0 refills | Status: AC

## 2021-08-16 NOTE — Progress Notes (Signed)
Virtual Visit Consent   Gerald [name], you are scheduled for a virtual visit with a [facility] provider today. Just as with appointments in the office, your consent must be obtained to participate. Your consent will be active for this visit and any virtual visit you may have with one of our providers in the next 365 days. If you have a MyChart account, a copy of this consent can be sent to you electronically.  As this is a virtual visit, video technology does not allow for your provider to perform a traditional examination. This may limit your provider's ability to fully assess your condition. If your provider identifies any concerns that need to be evaluated in person or the need to arrange testing (such as labs, EKG, etc.), we will make arrangements to do so. Although advances in technology are sophisticated, we cannot ensure that it will always work on either your end or our end. If the connection with a video visit is poor, the visit may have to be switched to a telephone visit. With either a video or telephone visit, we are not always able to ensure that we have a secure connection.  By engaging in this virtual visit, you consent to the provision of healthcare and authorize for your insurance to be billed (if applicable) for the services provided during this visit. Depending on your insurance coverage, you may receive a charge related to this service.  I need to obtain your verbal consent now. Are you willing to proceed with your visit today? Gerald [name] has provided verbal consent on 08/16/2021 for a virtual visit (video or telephone). Margaretann Loveless, PA-C  Date: 08/16/2021 4:21 PM  Virtual Visit via Video Note   I, Margaretann Loveless, connected with  Gerald [name]  ([id number], 1980/09/24) on 08/16/21 at  4:15 PM EDT by a video-enabled telemedicine application and verified that I am speaking with the correct person using two identifiers.  Location: Patient: Virtual Visit Location  Patient: Home Provider: Virtual Visit Location Provider: Home Office   I discussed the limitations of evaluation and management by telemedicine and the availability of in person appointments. The patient expressed understanding and agreed to proceed.    History of Present Illness: Gerald [name] is a 41 y.o. who identifies as a male who was assigned male at birth, and is being seen today for Covid 27.  HPI: URI  This is a new problem. Episode onset: Tested positive for Covid 19 today; symptoms started Saturday. The problem has been gradually worsening. Maximum temperature: 99.5. Associated symptoms include congestion, coughing, headaches, rhinorrhea, sinus pain and a sore throat (scratchy throat). Pertinent negatives include no diarrhea, ear pain, nausea, neck pain, swollen glands or wheezing. He has tried acetaminophen and increased fluids for the symptoms. The treatment provided mild relief.      Problems:  Patient Active Problem List   Diagnosis Date Noted   Prediabetes 08/13/2019   Thyroid nodule 12/12/2017   ADHD (attention deficit hyperactivity disorder), inattentive type 05/19/2017   Right cervical radiculopathy 07/14/2016   Health maintenance examination 05/20/2014   Allergic rhinitis 05/24/2010   Dyslipidemia 03/17/2010   Obesity, Class I, BMI 30-34.9 03/17/2010    Allergies:  Allergies  Allergen Reactions   Penicillins     REACTION: hives   Biaxin [Clarithromycin] Diarrhea   Medications:  Current Outpatient Medications:    nirmatrelvir/ritonavir EUA (PAXLOVID) 20 x 150 MG & 10 x 100MG  TABS, Take 3 tablets by mouth 2 (two) times  daily for 5 days. (Take nirmatrelvir 150 mg two tablets twice daily for 5 days and ritonavir 100 mg one tablet twice daily for 5 days) Patient GFR is 109, Disp: 30 tablet, Rfl: 0   acetaminophen (TYLENOL) 500 MG tablet, Take 500 mg by mouth every 6 (six) hours as needed for pain., Disp: , Rfl:    atomoxetine (STRATTERA) 80 MG capsule, Take 1  capsule (80 mg total) by mouth daily., Disp: 90 capsule, Rfl: 3   fluticasone (FLONASE) 50 MCG/ACT nasal spray, Place 2 sprays into both nostrils daily as needed for allergies or rhinitis., Disp: 16 g, Rfl: 6   loratadine (CLARITIN) 10 MG tablet, Take 10 mg by mouth as needed.  , Disp: , Rfl:    Multiple Vitamin (MULTIVITAMIN) tablet, Take 1 tablet by mouth daily., Disp: , Rfl:    pseudoephedrine (SUDAFED) 30 MG tablet, Take 30 mg by mouth every 4 (four) hours as needed for congestion., Disp: , Rfl:   Observations/Objective: Patient is well-developed, well-nourished in no acute distress.  Resting comfortably at home.  Head is normocephalic, atraumatic.  No labored breathing.  Speech is clear and coherent with logical content.  Patient is alert and oriented at baseline.    Assessment and Plan: 1. COVID-19 - nirmatrelvir/ritonavir EUA (PAXLOVID) 20 x 150 MG & 10 x 100MG  TABS; Take 3 tablets by mouth 2 (two) times daily for 5 days. (Take nirmatrelvir 150 mg two tablets twice daily for 5 days and ritonavir 100 mg one tablet twice daily for 5 days) Patient GFR is 109  Dispense: 30 tablet; Refill: 0  - Continue OTC symptomatic management of choice - Will send OTC vitamins and supplement information through AVS - Paxlovid prescribed - Patient enrolled in MyChart symptom monitoring - Push fluids - Rest as needed - Discussed return precautions and when to seek in-person evaluation, sent via AVS as well   Follow Up Instructions: I discussed the assessment and treatment plan with the patient. The patient was provided an opportunity to ask questions and all were answered. The patient agreed with the plan and demonstrated an understanding of the instructions.  A copy of instructions were sent to the patient via MyChart unless otherwise noted below.    The patient was advised to call back or seek an in-person evaluation if the symptoms worsen or if the condition fails to improve as  anticipated.  Time:  I spent 13 minutes with the patient via telehealth technology discussing the above problems/concerns.    , PA-C

## 2021-08-16 NOTE — Patient Instructions (Signed)
Karene Fry, thank you for joining Mar Daring, PA-C for today's virtual visit.  While this provider is not your primary care provider (PCP), if your PCP is located in our provider database this encounter information will be shared with them immediately following your visit.  Consent: (Patient) Gerald [name] provided verbal consent for this virtual visit at the beginning of the encounter.  Current Medications:  Current Outpatient Medications:    nirmatrelvir/ritonavir EUA (PAXLOVID) 20 x 150 MG & 10 x 100MG  TABS, Take 3 tablets by mouth 2 (two) times daily for 5 days. (Take nirmatrelvir 150 mg two tablets twice daily for 5 days and ritonavir 100 mg one tablet twice daily for 5 days) Patient GFR is 109, Disp: 30 tablet, Rfl: 0   acetaminophen (TYLENOL) 500 MG tablet, Take 500 mg by mouth every 6 (six) hours as needed for pain., Disp: , Rfl:    atomoxetine (STRATTERA) 80 MG capsule, Take 1 capsule (80 mg total) by mouth daily., Disp: 90 capsule, Rfl: 3   fluticasone (FLONASE) 50 MCG/ACT nasal spray, Place 2 sprays into both nostrils daily as needed for allergies or rhinitis., Disp: 16 g, Rfl: 6   loratadine (CLARITIN) 10 MG tablet, Take 10 mg by mouth as needed.  , Disp: , Rfl:    Multiple Vitamin (MULTIVITAMIN) tablet, Take 1 tablet by mouth daily., Disp: , Rfl:    pseudoephedrine (SUDAFED) 30 MG tablet, Take 30 mg by mouth every 4 (four) hours as needed for congestion., Disp: , Rfl:    Medications ordered in this encounter:  Meds ordered this encounter  Medications   nirmatrelvir/ritonavir EUA (PAXLOVID) 20 x 150 MG & 10 x 100MG  TABS    Sig: Take 3 tablets by mouth 2 (two) times daily for 5 days. (Take nirmatrelvir 150 mg two tablets twice daily for 5 days and ritonavir 100 mg one tablet twice daily for 5 days) Patient GFR is 109    Dispense:  30 tablet    Refill:  0    Order Specific Question:   Supervising Provider    Answer:   Noemi Chapel [id number]     *If you need  refills on other medications prior to your next appointment, please contact your pharmacy*  Follow-Up: Call back or seek an in-person evaluation if the symptoms worsen or if the condition fails to improve as anticipated.  Other Instructions COVID-19: Quarantine and Isolation Quarantine If you were exposed Quarantine and stay away from others when you have been in close contact with someone who has COVID-19. Isolate If you are sick or test positive Isolate when you are sick or when you have COVID-19, even if you don't have symptoms. When to stay home Calculating quarantine The date of your exposure is considered day 0. Day 1 is the first full day after your last contact with a person who has had COVID-19. Stay home and away from other people for at least 5 days. Learn why CDC updated guidance for the general public. IF YOU were exposed to COVID-19 and are NOT  up to dateIF YOU were exposed to COVID-19 and are NOT on COVID-19 vaccinations Quarantine for at least 5 days Stay home Stay home and quarantine for at least 5 full days. Wear a well-fitting mask if you must be around others in your home. Do not travel. Get tested Even if you don't develop symptoms, get tested at least 5 days after you last had close contact with someone with COVID-19. After quarantine Watch  for symptoms Watch for symptoms until 10 days after you last had close contact with someone with COVID-19. Avoid travel It is best to avoid travel until a full 10 days after you last had close contact with someone with COVID-19. If you develop symptoms Isolate immediately and get tested. Continue to stay home until you know the results. Wear a well-fitting mask around others. Take precautions until day 10 Wear a well-fitting mask Wear a well-fitting mask for 10 full days any time you are around others inside your home or in public. Do not go to places where you are unable to wear a well-fitting mask. If you must travel  during days 6-10, take precautions. Avoid being around people who are more likely to get very sick from COVID-19. IF YOU were exposed to COVID-19 and are  up to dateIF YOU were exposed to COVID-19 and are on COVID-19 vaccinations No quarantine You do not need to stay home unless you develop symptoms. Get tested Even if you don't develop symptoms, get tested at least 5 days after you last had close contact with someone with COVID-19. Watch for symptoms Watch for symptoms until 10 days after you last had close contact with someone with COVID-19. If you develop symptoms Isolate immediately and get tested. Continue to stay home until you know the results. Wear a well-fitting mask around others. Take precautions until day 10 Wear a well-fitting mask Wear a well-fitting mask for 10 full days any time you are around others inside your home or in public. Do not go to places where you are unable to wear a well-fitting mask. Take precautions if traveling Avoid being around people who are more likely to get very sick from COVID-19. IF YOU were exposed to COVID-19 and had confirmed COVID-19 within the past 90 days (you tested positive using a viral test) No quarantine You do not need to stay home unless you develop symptoms. Watch for symptoms Watch for symptoms until 10 days after you last had close contact with someone with COVID-19. If you develop symptoms Isolate immediately and get tested. Continue to stay home until you know the results. Wear a well-fitting mask around others. Take precautions until day 10 Wear a well-fitting mask Wear a well-fitting mask for 10 full days any time you are around others inside your home or in public. Do not go to places where you are unable to wear a well-fitting mask. Take precautions if traveling Avoid being around people who are more likely to get very sick from COVID-19. Calculating isolation Day 0 is your first day of symptoms or a positive viral test.  Day 1 is the first full day after your symptoms developed or your test specimen was collected. If you have COVID-19 or have symptoms, isolate for at least 5 days. IF YOU tested positive for COVID-19 or have symptoms, regardless of vaccination status Stay home for at least 5 days Stay home for 5 days and isolate from others in your home. Wear a well-fitting mask if you must be around others in your home. Do not travel. Ending isolation if you had symptoms End isolation after 5 full days if you are fever-free for 24 hours (without the use of fever-reducing medication) and your symptoms are improving. Ending isolation if you did NOT have symptoms End isolation after at least 5 full days after your positive test. If you got very sick from COVID-19 or have a weakened immune system You should isolate for at least 10 days. Consult  your doctor before ending isolation. Take precautions until day 10 Wear a well-fitting mask Wear a well-fitting mask for 10 full days any time you are around others inside your home or in public. Do not go to places where you are unable to wear a well-fitting mask. Do not travel Do not travel until a full 10 days after your symptoms started or the date your positive test was taken if you had no symptoms. Avoid being around people who are more likely to get very sick from COVID-19. Definitions Exposure Contact with someone infected with SARS-CoV-2, the virus that causes COVID-19, in a way that increases the likelihood of getting infected with the virus. Close contact A close contact is someone who was less than 6 feet away from an infected person (laboratory-confirmed or a clinical diagnosis) for a cumulative total of 15 minutes or more over a 24-hour period. For example, three individual 5-minute exposures for a total of 15 minutes. People who are exposed to someone with COVID-19 after they completed at least 5 days of isolation are not considered close  contacts. Julio Sicks is a strategy used to prevent transmission of COVID-19 by keeping people who have been in close contact with someone with COVID-19 apart from others. Who does not need to quarantine? If you had close contact with someone with COVID-19 and you are in one of the following groups, you do not need to quarantine. You are up to date with your COVID-19 vaccines. You had confirmed COVID-19 within the last 90 days (meaning you tested positive using a viral test). If you are up to date with COVID-19 vaccines, you should wear a well-fitting mask around others for 10 days from the date of your last close contact with someone with COVID-19 (the date of last close contact is considered day 0). Get tested at least 5 days after you last had close contact with someone with COVID-19. If you test positive or develop COVID-19 symptoms, isolate from other people and follow recommendations in the Isolation section below. If you tested positive for COVID-19 with a viral test within the previous 90 days and subsequently recovered and remain without COVID-19 symptoms, you do not need to quarantine or get tested after close contact. You should wear a well-fitting mask around others for 10 days from the date of your last close contact with someone with COVID-19 (the date of last close contact is considered day 0). If you have COVID-19 symptoms, get tested and isolate from other people and follow recommendations in the Isolation section below. Who should quarantine? If you come into close contact with someone with COVID-19, you should quarantine if you are not up to date on COVID-19 vaccines. This includes people who are not vaccinated. What to do for quarantine Stay home and away from other people for at least 5 days (day 0 through day 5) after your last contact with a person who has COVID-19. The date of your exposure is considered day 0. Wear a well-fitting mask when around others at home, if  possible. For 10 days after your last close contact with someone with COVID-19, watch for fever (100.26F or greater), cough, shortness of breath, or other COVID-19 symptoms. If you develop symptoms, get tested immediately and isolate until you receive your test results. If you test positive, follow isolation recommendations. If you do not develop symptoms, get tested at least 5 days after you last had close contact with someone with COVID-19. If you test negative, you can leave your  home, but continue to wear a well-fitting mask when around others at home and in public until 10 days after your last close contact with someone with COVID-19. If you test positive, you should isolate for at least 5 days from the date of your positive test (if you do not have symptoms). If you do develop COVID-19 symptoms, isolate for at least 5 days from the date your symptoms began (the date the symptoms started is day 0). Follow recommendations in the isolation section below. If you are unable to get a test 5 days after last close contact with someone with COVID-19, you can leave your home after day 5 if you have been without COVID-19 symptoms throughout the 5-day period. Wear a well-fitting mask for 10 days after your date of last close contact when around others at home and in public. Avoid people who are have weakened immune systems or are more likely to get very sick from COVID-19, and nursing homes and other high-risk settings, until after at least 10 days. If possible, stay away from people you live with, especially people who are at higher risk for getting very sick from COVID-19, as well as others outside your home throughout the full 10 days after your last close contact with someone with COVID-19. If you are unable to quarantine, you should wear a well-fitting mask for 10 days when around others at home and in public. If you are unable to wear a mask when around others, you should continue to quarantine for 10  days. Avoid people who have weakened immune systems or are more likely to get very sick from COVID-19, and nursing homes and other high-risk settings, until after at least 10 days. See additional information about travel. Do not go to places where you are unable to wear a mask, such as restaurants and some gyms, and avoid eating around others at home and at work until after 10 days after your last close contact with someone with COVID-19. After quarantine Watch for symptoms until 10 days after your last close contact with someone with COVID-19. If you have symptoms, isolate immediately and get tested. Quarantine in high-risk congregate settings In certain congregate settings that have high risk of secondary transmission (such as Systems analyst and detention facilities, homeless shelters, or cruise ships), CDC recommends a 10-day quarantine for residents, regardless of vaccination and booster status. During periods of critical staffing shortages, facilities may consider shortening the quarantine period for staff to ensure continuity of operations. Decisions to shorten quarantine in these settings should be made in consultation with state, local, tribal, or territorial health departments and should take into consideration the context and characteristics of the facility. CDC's setting-specific guidance provides additional recommendations for these settings. Isolation Isolation is used to separate people with confirmed or suspected COVID-19 from those without COVID-19. People who are in isolation should stay home until it's safe for them to be around others. At home, anyone sick or infected should separate from others, or wear a well-fitting mask when they need to be around others. People in isolation should stay in a specific "sick room" or area and use a separate bathroom if available. Everyone who has presumed or confirmed COVID-19 should stay home and isolate from other people for at least 5 full days (day 0  is the first day of symptoms or the date of the day of the positive viral test for asymptomatic persons). They should wear a mask when around others at home and in public for an additional  5 days. People who are confirmed to have COVID-19 or are showing symptoms of COVID-19 need to isolate regardless of their vaccination status. This includes: People who have a positive viral test for COVID-19, regardless of whether or not they have symptoms. People with symptoms of COVID-19, including people who are awaiting test results or have not been tested. People with symptoms should isolate even if they do not know if they have been in close contact with someone with COVID-19. What to do for isolation Monitor your symptoms. If you have an emergency warning sign (including trouble breathing), seek emergency medical care immediately. Stay in a separate room from other household members, if possible. Use a separate bathroom, if possible. Take steps to improve ventilation at home, if possible. Avoid contact with other members of the household and pets. Don't share personal household items, like cups, towels, and utensils. Wear a well-fitting mask when you need to be around other people. Learn more about what to do if you are sick and how to notify your contacts. Ending isolation for people who had COVID-19 and had symptoms If you had COVID-19 and had symptoms, isolate for at least 5 days. To calculate your 5-day isolation period, day 0 is your first day of symptoms. Day 1 is the first full day after your symptoms developed. You can leave isolation after 5 full days. You can end isolation after 5 full days if you are fever-free for 24 hours without the use of fever-reducing medication and your other symptoms have improved (Loss of taste and smell may persist for weeks or months after recovery and need not delay the end of isolation). You should continue to wear a well-fitting mask around others at home and in  public for 5 additional days (day 6 through day 10) after the end of your 5-day isolation period. If you are unable to wear a mask when around others, you should continue to isolate for a full 10 days. Avoid people who have weakened immune systems or are more likely to get very sick from COVID-19, and nursing homes and other high-risk settings, until after at least 10 days. If you continue to have fever or your other symptoms have not improved after 5 days of isolation, you should wait to end your isolation until you are fever-free for 24 hours without the use of fever-reducing medication and your other symptoms have improved. Continue to wear a well-fitting mask through day 10. Contact your healthcare provider if you have questions. See additional information about travel. Do not go to places where you are unable to wear a mask, such as restaurants and some gyms, and avoid eating around others at home and at work until a full 10 days after your first day of symptoms. If an individual has access to a test and wants to test, the best approach is to use an antigen test1 towards the end of the 5-day isolation period. Collect the test sample only if you are fever-free for 24 hours without the use of fever-reducing medication and your other symptoms have improved (loss of taste and smell may persist for weeks or months after recovery and need not delay the end of isolation). If your test result is positive, you should continue to isolate until day 10. If your test result is negative, you can end isolation, but continue to wear a well-fitting mask around others at home and in public until day 10. Follow additional recommendations for masking and avoiding travel as described above. 1As noted  in the labeling for authorized over-the counter antigen tests: Negative results should be treated as presumptive. Negative results do not rule out SARS-CoV-2 infection and should not be used as the sole basis for treatment or  patient management decisions, including infection control decisions. To improve results, antigen tests should be used twice over a three-day period with at least 24 hours and no more than 48 hours between tests. Note that these recommendations on ending isolation do not apply to people who are moderately ill or very sick from COVID-19 or have weakened immune systems. See section below for recommendations for when to end isolation for these groups. Ending isolation for people who tested positive for COVID-19 but had no symptoms If you test positive for COVID-19 and never develop symptoms, isolate for at least 5 days. Day 0 is the day of your positive viral test (based on the date you were tested) and day 1 is the first full day after the specimen was collected for your positive test. You can leave isolation after 5 full days. If you continue to have no symptoms, you can end isolation after at least 5 days. You should continue to wear a well-fitting mask around others at home and in public until day 10 (day 6 through day 10). If you are unable to wear a mask when around others, you should continue to isolate for 10 days. Avoid people who have weakened immune systems or are more likely to get very sick from COVID-19, and nursing homes and other high-risk settings, until after at least 10 days. If you develop symptoms after testing positive, your 5-day isolation period should start over. Day 0 is your first day of symptoms. Follow the recommendations above for ending isolation for people who had COVID-19 and had symptoms. See additional information about travel. Do not go to places where you are unable to wear a mask, such as restaurants and some gyms, and avoid eating around others at home and at work until 10 days after the day of your positive test. If an individual has access to a test and wants to test, the best approach is to use an antigen test1 towards the end of the 5-day isolation period. If your test  result is positive, you should continue to isolate until day 10. If your test result is positive, you can also choose to test daily and if your test result is negative, you can end isolation, but continue to wear a well-fitting mask around others at home and in public until day 10. Follow additional recommendations for masking and avoiding travel as described above. 1As noted in the labeling for authorized over-the counter antigen tests: Negative results should be treated as presumptive. Negative results do not rule out SARS-CoV-2 infection and should not be used as the sole basis for treatment or patient management decisions, including infection control decisions. To improve results, antigen tests should be used twice over a three-day period with at least 24 hours and no more than 48 hours between tests. Ending isolation for people who were moderately or very sick from COVID-19 or have a weakened immune system People who are moderately ill from COVID-19 (experiencing symptoms that affect the lungs like shortness of breath or difficulty breathing) should isolate for 10 days and follow all other isolation precautions. To calculate your 10-day isolation period, day 0 is your first day of symptoms. Day 1 is the first full day after your symptoms developed. If you are unsure if your symptoms are moderate, talk  to a healthcare provider for further guidance. People who are very sick from COVID-19 (this means people who were hospitalized or required intensive care or ventilation support) and people who have weakened immune systems might need to isolate at home longer. They may also require testing with a viral test to determine when they can be around others. CDC recommends an isolation period of at least 10 and up to 20 days for people who were very sick from COVID-19 and for people with weakened immune systems. Consult with your healthcare provider about when you can resume being around other people. If you are  unsure if your symptoms are severe or if you have a weakened immune system, talk to a healthcare provider for further guidance. People who have a weakened immune system should talk to their healthcare provider about the potential for reduced immune responses to COVID-19 vaccines and the need to continue to follow current prevention measures (including wearing a well-fitting mask and avoiding crowds and poorly ventilated indoor spaces) to protect themselves against COVID-19 until advised otherwise by their healthcare provider. Close contacts of immunocompromised people--including household members--should also be encouraged to receive all recommended COVID-19 vaccine doses to help protect these people. Isolation in high-risk congregate settings In certain high-risk congregate settings that have high risk of secondary transmission and where it is not feasible to cohort people (such as Systems analyst and detention facilities, homeless shelters, and cruise ships), CDC recommends a 10-day isolation period for residents. During periods of critical staffing shortages, facilities may consider shortening the isolation period for staff to ensure continuity of operations. Decisions to shorten isolation in these settings should be made in consultation with state, local, tribal, or territorial health departments and should take into consideration the context and characteristics of the facility. CDC's setting-specific guidance provides additional recommendations for these settings. This CDC guidance is meant to supplement--not replace--any federal, state, local, territorial, or tribal health and safety laws, rules, and regulations. Recommendations for specific settings These recommendations do not apply to healthcare professionals. For guidance specific to these settings, see Healthcare professionals: Interim Guidance for Optician, dispensing with SARS-CoV-2 Infection or Exposure to SARS-CoV-2 Patients, residents,  and visitors to healthcare settings: Interim Infection Prevention and Control Recommendations for Healthcare Personnel During the [place] (COVID-19) Pandemic Additional setting-specific guidance and recommendations are available. These recommendations on quarantine and isolation do apply to [facility] settings. Additional guidance is available here: Overview of COVID-19 Quarantine for K-12 Schools Travelers: Travel information and recommendations Congregate facilities and other settings: [employer] for community, work, and school settings Ongoing COVID-19 exposure FAQs I live with someone with COVID-19, but I cannot be separated from them. How do we manage quarantine in this situation? It is very important for people with COVID-19 to remain apart from other people, if possible, even if they are living together. If separation of the person with COVID-19 from others that they live with is not possible, the other people that they live with will have ongoing exposure, meaning they will be repeatedly exposed until that person is no longer able to spread the virus to other people. In this situation, there are precautions you can take to limit the spread of COVID-19: The person with COVID-19 and everyone they live with should wear a well-fitting mask inside the home. If possible, one person should care for the person with COVID-19 to limit the number of people who are in close contact with the infected person. Take steps to protect yourself and others  to reduce transmission in the home: Quarantine if you are not up to date with your COVID-19 vaccines. Isolate if you are sick or tested positive for COVID-19, even if you don't have symptoms. Learn more about the public health recommendations for testing, mask use and quarantine of close contacts, like yourself, who have ongoing exposure. These recommendations differ depending on your vaccination status. What should I do if I have ongoing  exposure to COVID-19 from someone I live with? Recommendations for this situation depend on your vaccination status: If you are not up to date on COVID-19 vaccines and have ongoing exposure to COVID-19, you should: Begin quarantine immediately and continue to quarantine throughout the isolation period of the person with COVID-19. Continue to quarantine for an additional 5 days starting the day after the end of isolation for the person with COVID-19. Get tested at least 5 days after the end of isolation of the infected person that lives with them. If you test negative, you can leave the home but should continue to wear a well-fitting mask when around others at home and in public until 10 days after the end of isolation for the person with COVID-19. Isolate immediately if you develop symptoms of COVID-19 or test positive. If you are up to date with COVID-19 vaccines and have ongoing exposure to COVID-19, you should: Get tested at least 5 days after your first exposure. A person with COVID-19 is considered infectious starting 2 days before they develop symptoms, or 2 days before the date of their positive test if they do not have symptoms. Get tested again at least 5 days after the end of isolation for the person with COVID-19. Wear a well-fitting mask when you are around the person with COVID-19, and do this throughout their isolation period. Wear a well-fitting mask around others for 10 days after the infected person's isolation period ends. Isolate immediately if you develop symptoms of COVID-19 or test positive. What should I do if multiple people I live with test positive for COVID-19 at different times? Recommendations for this situation depend on your vaccination status: If you are not up to date with your COVID-19 vaccines, you should: Quarantine throughout the isolation period of any infected person that you live with. Continue to quarantine until 5 days after the end of isolation date for  the most recently infected person that lives with you. For example, if the last day of isolation of the person most recently infected with COVID-19 was June 30, the new 5-day quarantine period starts on July 1. Get tested at least 5 days after the end of isolation for the most recently infected person that lives with you. Wear a well-fitting mask when you are around any person with COVID-19 while that person is in isolation. Wear a well-fitting mask when you are around other people until 10 days after your last close contact. Isolate immediately if you develop symptoms of COVID-19 or test positive. If you are up to date with your COVID-19 vaccines, you should: Get tested at least 5 days after your first exposure. A person with COVID-19 is considered infectious starting 2 days before they developed symptoms, or 2 days before the date of their positive test if they do not have symptoms. Get tested again at least 5 days after the end of isolation for the most recently infected person that lives with you. Wear a well-fitting mask when you are around any person with COVID-19 while that person is in isolation. Wear a well-fitting mask  around others for 10 days after the end of isolation for the most recently infected person that lives with you. For example, if the last day of isolation for the person most recently infected with COVID-19 was June 30, the new 10-day period to wear a well-fitting mask indoors in public starts on July 1. Isolate immediately if you develop symptoms of COVID-19 or test positive. I had COVID-19 and completed isolation. Do I have to quarantine or get tested if someone I live with gets COVID-19 shortly after I completed isolation? No. If you recently completed isolation and someone that lives with you tests positive for the virus that causes COVID-19 shortly after the end of your isolation period, you do not have to quarantine or get tested as long as you do not develop new symptoms.  Once all of the people that live together have completed isolation or quarantine, refer to the guidance below for new exposures to COVID-19. If you had COVID-19 in the previous 90 days and then came into close contact with someone with COVID-19, you do not have to quarantine or get tested if you do not have symptoms. But you should: Wear a well-fitting mask indoors in public for 10 days after your last close contact. Monitor for COVID-19 symptoms for 10 days from the date of your last close contact. Isolate immediately and get tested if symptoms develop. If more than 90 days have passed since your recovery from infection, follow CDC's recommendations for close contacts. These recommendations will differ depending on your vaccination status. 06/03/2020 Content source: [facility] ([facility]), Division of Viral Diseases This information is not intended to replace advice given to you by your health care provider. Make sure you discuss any questions you have with your health care provider. Document Revised: 10/07/2020 Document Reviewed: 10/07/2020 Elsevier Patient Education  2022 [employer].    If you have been instructed to have an in-person evaluation today at a local Urgent Care facility, please use the link below. It will take you to a list of all of our available [facility], including address, phone number and hours of operation. Please do not delay care.  [facility]  If you or a family member do not have a primary care provider, use the link below to schedule a visit and establish care. When you choose a [facility] primary care physician or advanced practice provider, you gain a long-term partner in health. Find a Primary Care Provider  Learn more about [facility]'s in-office and virtual care options: [facility]

## 2021-09-14 ENCOUNTER — Ambulatory Visit (INDEPENDENT_AMBULATORY_CARE_PROVIDER_SITE_OTHER): Payer: Managed Care, Other (non HMO) | Admitting: Family Medicine

## 2021-09-14 ENCOUNTER — Encounter: Payer: Self-pay | Admitting: Family Medicine

## 2021-09-14 VITALS — BP 118/80 | HR 86 | Temp 98.3°F | Ht 67.5 in | Wt 210.2 lb

## 2021-09-14 DIAGNOSIS — R030 Elevated blood-pressure reading, without diagnosis of hypertension: Secondary | ICD-10-CM | POA: Insufficient documentation

## 2021-09-14 DIAGNOSIS — E669 Obesity, unspecified: Secondary | ICD-10-CM | POA: Diagnosis not present

## 2021-09-14 DIAGNOSIS — R200 Anesthesia of skin: Secondary | ICD-10-CM | POA: Insufficient documentation

## 2021-09-14 DIAGNOSIS — G5622 Lesion of ulnar nerve, left upper limb: Secondary | ICD-10-CM | POA: Diagnosis not present

## 2021-09-14 DIAGNOSIS — R002 Palpitations: Secondary | ICD-10-CM

## 2021-09-14 DIAGNOSIS — R0789 Other chest pain: Secondary | ICD-10-CM

## 2021-09-14 DIAGNOSIS — Z8249 Family history of ischemic heart disease and other diseases of the circulatory system: Secondary | ICD-10-CM

## 2021-09-14 NOTE — Assessment & Plan Note (Signed)
Acute   Moderate risk for CVD given family history, former smoker and elevated cholesterol.  EKG: normal EKG, normal sinus rhythm, unchanged from previous tracings.  Most liekly pain from MSK strain and resulting cervical vs ulnar neuropathy on  Left.   Discussed need for repeat risk factor assessment ( he has routine labs coming up where he works at [employer]) as well as further evaluation if symptoms recur.

## 2021-09-14 NOTE — Progress Notes (Signed)
Patient ID: Gerald [name], male    DOB: Aug 27, 1980, 41 y.o.   MRN: [id number]  This visit was conducted in person.  BP 118/80   Pulse 86   Temp 98.3 F (36.8 C) (Oral)   Ht 5' 7.5" (1.715 m)   Wt 210 lb 4 oz (95.4 kg)   SpO2 99%   BMI 32.44 kg/m    CC:  Chief Complaint  Patient presents with   Hypertension    Over the weekend 117/91-last night 135/89 this morning   Tingling    Left Arm-Lifted up box springs and mattress over this weekend    Subjective:   HPI: SAHAND GOSCH is a 41 y.o. male patient of Dr. Danise Mina with history of ADHD, obesity, prediabetes and thyroid issues presenting on 09/14/2021 for Hypertension (Over the weekend/117/91-last night/135/89 this morning) and Tingling (Left Arm-Lifted up box springs and mattress over this weekend)  He reports new onset blood pressure elevation in the last 4 days.  Measurements at home 117/91, 135/89.  He has noted heart palpitations off and on. Headaches in last few days. Of note he is on Strattera for ADD and has pseudoephedrine 30 mg every 4 hours as needed listed on his medication list ( took one last week) He reports his caffeine intake is per day... 2 x 16 oz bottles a day... no change. BP Readings from Last 3 Encounters:  09/14/21 118/80  12/01/20 120/82  08/13/19 110/80   Wt Readings from Last 3 Encounters:  09/14/21 210 lb 4 oz (95.4 kg)  12/01/20 206 lb 7 oz (93.6 kg)  08/13/19 226 lb 2 oz (102.6 kg)   Body mass index is 32.44 kg/m.  Over the weekend he also lifted up a box frames mattress and noted following this tingling in his left arm .  Ache on left upper chest wall, with moving.. intermittent.. No clearly exertional.   Pinkly side of hand numb.    He is under a lot of stress, lately.  Family history of  MI. Sister age 49s ( protein S deficiency)     Former smoker, remote. Lab Results  Component Value Date   CHOL 178 12/01/2020   HDL 35 (L) 12/01/2020   LDLCALC 123 (H) 12/01/2020    LDLDIRECT 96 07/07/2010   TRIG 110 12/01/2020   CHOLHDL 5.1 (H) 12/01/2020  The 10-year ASCVD risk score (Arnett DK, et al., 2019) is: 1.6%   Values used to calculate the score:     Age: 83 years     Sex: Male     Is Non-Hispanic African American: No     Diabetic: No     Tobacco smoker: No     Systolic Blood Pressure: 174 mmHg     Is BP treated: No     HDL Cholesterol: 35 mg/dL     Total Cholesterol: 178 mg/dL   Relevant past medical, surgical, family and social history reviewed and updated as indicated. Interim medical history since our last visit reviewed. Allergies and medications reviewed and updated. Outpatient Medications Prior to Visit  Medication Sig Dispense Refill   acetaminophen (TYLENOL) 500 MG tablet Take 500 mg by mouth every 6 (six) hours as needed for pain.     atomoxetine (STRATTERA) 80 MG capsule Take 1 capsule (80 mg total) by mouth daily. 90 capsule 3   fluticasone (FLONASE) 50 MCG/ACT nasal spray Place 2 sprays into both nostrils daily as needed for allergies or rhinitis. 16 g 6   loratadine (  CLARITIN) 10 MG tablet Take 10 mg by mouth as needed.       Multiple Vitamin (MULTIVITAMIN) tablet Take 1 tablet by mouth daily.     pseudoephedrine (SUDAFED) 30 MG tablet Take 30 mg by mouth every 4 (four) hours as needed for congestion.     No facility-administered medications prior to visit.     Per HPI unless specifically indicated in ROS section below Review of Systems  Constitutional:  Negative for fatigue and fever.  HENT:  Negative for ear pain.   Eyes:  Negative for pain.  Respiratory:  Negative for cough and shortness of breath.   Cardiovascular:  Negative for chest pain, palpitations and leg swelling.  Gastrointestinal:  Negative for abdominal pain.  Genitourinary:  Negative for dysuria.  Musculoskeletal:  Negative for arthralgias.  Neurological:  Negative for syncope, light-headedness and headaches.  Psychiatric/Behavioral:  Negative for dysphoric mood.     Objective:  BP 118/80   Pulse 86   Temp 98.3 F (36.8 C) (Oral)   Ht 5' 7.5" (1.715 m)   Wt 210 lb 4 oz (95.4 kg)   SpO2 99%   BMI 32.44 kg/m   Wt Readings from Last 3 Encounters:  09/14/21 210 lb 4 oz (95.4 kg)  12/01/20 206 lb 7 oz (93.6 kg)  08/13/19 226 lb 2 oz (102.6 kg)      Physical Exam Constitutional:      Appearance: He is well-developed.  HENT:     Head: Normocephalic.     Right Ear: Hearing normal.     Left Ear: Hearing normal.     Nose: Nose normal.  Neck:     Thyroid: No thyroid mass or thyromegaly.     Vascular: No carotid bruit.     Trachea: Trachea normal.  Cardiovascular:     Rate and Rhythm: Normal rate and regular rhythm.     Pulses: Normal pulses.     Heart sounds: Heart sounds not distant. No murmur heard.    No friction rub. No gallop.     Comments: No peripheral edema Pulmonary:     Effort: Pulmonary effort is normal. No respiratory distress.     Breath sounds: Normal breath sounds.  Musculoskeletal:     Comments: Slightly positive spurling on left, also slightly reproducible symptoms with pressure on ulnar nerve at elbow Neg tinel and phalen  Skin:    General: Skin is warm and dry.     Findings: No rash.  Psychiatric:        Speech: Speech normal.        Behavior: Behavior normal.        Thought Content: Thought content normal.       Results for orders placed or performed in visit on 12/01/20  Lipid panel  Result Value Ref Range   Cholesterol, Total 178 100 - 199 mg/dL   Triglycerides 110 0 - 149 mg/dL   HDL 35 (L) >39 mg/dL   VLDL Cholesterol Cal 20 5 - 40 mg/dL   LDL Chol Calc (NIH) 123 (H) 0 - 99 mg/dL   Chol/HDL Ratio 5.1 (H) 0.0 - 5.0 ratio  Comprehensive metabolic panel  Result Value Ref Range   Glucose 84 70 - 99 mg/dL   BUN 8 6 - 24 mg/dL   Creatinine, Ser 0.91 0.76 - 1.27 mg/dL   eGFR 109 >59 mL/min/1.73   BUN/Creatinine Ratio 9 9 - 20   Sodium 140 134 - 144 mmol/L   Potassium 4.4 3.5 - 5.2  mmol/L   Chloride 101  96 - 106 mmol/L   CO2 26 20 - 29 mmol/L   Calcium 9.3 8.7 - 10.2 mg/dL   Total Protein 7.0 6.0 - 8.5 g/dL   Albumin 4.7 4.0 - 5.0 g/dL   Globulin, Total 2.3 1.5 - 4.5 g/dL   Albumin/Globulin Ratio 2.0 1.2 - 2.2   Bilirubin Total 0.9 0.0 - 1.2 mg/dL   Alkaline Phosphatase 88 44 - 121 IU/L   AST 16 0 - 40 IU/L   ALT 30 0 - 44 IU/L  Hemoglobin A1c  Result Value Ref Range   Hgb A1c MFr Bld 6.0 (H) 4.8 - 5.6 %   Est. average glucose Bld gHb Est-mCnc 126 mg/dL  TSH  Result Value Ref Range   TSH 3.680 0.450 - 4.500 uIU/mL     COVID 19 screen:  No recent travel or known exposure to COVID19 The patient denies respiratory symptoms of COVID 19 at this time. The importance of social distancing was discussed today.   Assessment and Plan    Problem List Items Addressed This Visit     Atypical chest pain     Acute   Moderate risk for CVD given family history, former smoker and elevated cholesterol.  EKG: normal EKG, normal sinus rhythm, unchanged from previous tracings.  Most liekly pain from MSK strain and resulting cervical vs ulnar neuropathy on  Left.   Discussed need for repeat risk factor assessment ( he has routine labs coming up where he works at [employer]) as well as further evaluation if symptoms recur.      Relevant Orders   EKG 12-Lead (Completed)   Elevated blood pressure reading without diagnosis of hypertension - Primary    Acute, blood pressure now back in normal range.  Potentially due to anxiety and stress      Family history of MI (myocardial infarction)   Numbness and tingling in left arm    Most likely related to cervial vs ulnar neuropathy.  recommended NSAID prn.      Obesity, Class I, BMI 30-34.9   Palpitations    Acute, normal heart rate in office today.  Instructed patient to decrease or stop caffeine, stop decongestant and increase water intake           Eliezer Lofts, MD

## 2021-09-14 NOTE — Assessment & Plan Note (Signed)
Acute, blood pressure now back in normal range.  Potentially due to anxiety and stress

## 2021-09-14 NOTE — Assessment & Plan Note (Signed)
Most likely related to cervial vs ulnar neuropathy.  recommended NSAID prn.

## 2021-09-14 NOTE — Patient Instructions (Addendum)
Avoid  decongestants.   Decrease or stop caffeine, increase water.  Work on low cholesterol diet.. make sure to have yearly wellness labs at work.  If symptoms recur.. consider referral to cardiology given somewhat increased risk for heart disease.

## 2021-09-14 NOTE — Assessment & Plan Note (Signed)
Acute, normal heart rate in office today.  Instructed patient to decrease or stop caffeine, stop decongestant and increase water intake

## 2021-10-26 LAB — LIPID PANEL
Cholesterol: 179 (ref 0–200)
HDL: 44 (ref 35–70)
LDL Cholesterol: 118
Triglycerides: 94 (ref 40–160)

## 2021-10-26 LAB — BASIC METABOLIC PANEL
Creatinine: 1 (ref 0.6–1.3)
Glucose: 85

## 2021-10-26 LAB — HEMOGLOBIN A1C: Hemoglobin A1C: 5.8

## 2021-10-28 ENCOUNTER — Encounter: Payer: Self-pay | Admitting: Family Medicine

## 2021-10-28 NOTE — Telephone Encounter (Signed)
Printed and placed in Dr. Timoteo Expose box.

## 2021-11-01 ENCOUNTER — Encounter: Payer: Self-pay | Admitting: Family Medicine

## 2021-11-01 NOTE — Telephone Encounter (Signed)
Abstracted results and placed to scan.

## 2021-11-24 ENCOUNTER — Ambulatory Visit (INDEPENDENT_AMBULATORY_CARE_PROVIDER_SITE_OTHER): Payer: Managed Care, Other (non HMO) | Admitting: Family Medicine

## 2021-11-24 ENCOUNTER — Encounter: Payer: Self-pay | Admitting: Family Medicine

## 2021-11-24 VITALS — BP 124/82 | HR 71 | Temp 97.7°F | Ht 67.25 in | Wt 205.4 lb

## 2021-11-24 DIAGNOSIS — F900 Attention-deficit hyperactivity disorder, predominantly inattentive type: Secondary | ICD-10-CM

## 2021-11-24 DIAGNOSIS — E785 Hyperlipidemia, unspecified: Secondary | ICD-10-CM | POA: Diagnosis not present

## 2021-11-24 DIAGNOSIS — Z0000 Encounter for general adult medical examination without abnormal findings: Secondary | ICD-10-CM

## 2021-11-24 DIAGNOSIS — E669 Obesity, unspecified: Secondary | ICD-10-CM | POA: Diagnosis not present

## 2021-11-24 DIAGNOSIS — R7303 Prediabetes: Secondary | ICD-10-CM

## 2021-11-24 DIAGNOSIS — J302 Other seasonal allergic rhinitis: Secondary | ICD-10-CM

## 2021-11-24 MED ORDER — ATOMOXETINE HCL 80 MG PO CAPS: 80.0000 mg | ORAL_CAPSULE | Freq: Every day | ORAL | 3 refills | Status: DC

## 2021-11-24 NOTE — Assessment & Plan Note (Signed)
Encouraged healthy diet and lifestyle choices for sustainable weight loss. He is using [employer] through work.

## 2021-11-24 NOTE — Assessment & Plan Note (Signed)
Preventative protocols reviewed and updated unless pt declined. Discussed healthy diet and lifestyle.  

## 2021-11-24 NOTE — Assessment & Plan Note (Signed)
Stable period on strattera 80mg  daily - continue this.

## 2021-11-24 NOTE — Assessment & Plan Note (Signed)
Encouraged limiting added sugars/sweetened beverages.  

## 2021-11-24 NOTE — Progress Notes (Signed)
Patient ID: Gerald [name], male    DOB: 1980-12-27, 41 y.o.   MRN: [id number]  This visit was conducted in person.  BP 124/82   Pulse 71   Temp 97.7 F (36.5 C) (Temporal)   Ht 5' 7.25" (1.708 m)   Wt 205 lb 6 oz (93.2 kg)   SpO2 98%   BMI 31.93 kg/m    CC: CPE Subjective:   HPI: Gerald [name] is a 41 y.o. male presenting on 11/24/2021 for Annual Exam   ADHD - on strattera 80mg  daily, tolerating well.  Working on healthy lifestyle app through work.  Working at [employer].   Sister passed away 1.5 yrs ago   Preventative: Flu shot - declines  COVID series - Pfizer 06/2019 x2, no booster  Td - 1995, 03/2010  Seat belt use discussed.  Sunscreen use discussed. No changing moles.  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 Interim medical history since our last visit reviewed. Allergies and medications reviewed and updated. Outpatient Medications Prior to Visit  Medication Sig Dispense Refill   fluticasone (FLONASE) 50 MCG/ACT nasal spray Place 2 sprays into both nostrils daily as needed for allergies or rhinitis. 16 g 6   loratadine (CLARITIN) 10 MG tablet Take 10 mg by mouth as needed.       Multiple Vitamin (MULTIVITAMIN) tablet Take 1 tablet by mouth daily.     pseudoephedrine (SUDAFED) 30 MG tablet Take 30 mg by mouth every 4 (four) hours as needed for congestion.     atomoxetine (STRATTERA) 80 MG capsule Take 1 capsule (80 mg total) by mouth daily. 90 capsule 3   acetaminophen (TYLENOL) 500 MG tablet Take 500 mg by mouth every 6 (six) hours as needed for pain.     No facility-administered  medications prior to visit.     Per HPI unless specifically indicated in ROS section below Review of Systems  Constitutional:  Negative for activity change, appetite change, chills, fatigue, fever and unexpected weight change.  HENT:  Negative for hearing loss.   Eyes:  Negative for visual disturbance.  Respiratory:  Negative for cough, chest tightness, shortness of breath and wheezing.   Cardiovascular:  Negative for chest pain, palpitations and leg swelling.  Gastrointestinal:  Negative for abdominal distention, abdominal pain, blood in stool, constipation, diarrhea, nausea and vomiting.  Genitourinary:  Negative for difficulty urinating and hematuria.  Musculoskeletal:  Negative for arthralgias, myalgias and neck pain.  Skin:  Negative for rash.  Neurological:  Negative for dizziness, seizures, syncope and headaches.  Hematological:  Negative for adenopathy. Does not bruise/bleed easily.  Psychiatric/Behavioral:  Negative for dysphoric mood. The patient is not nervous/anxious.     Objective:  BP 124/82   Pulse 71   Temp 97.7 F (36.5 C) (Temporal)   Ht 5' 7.25" (1.708 m)   Wt 205 lb 6 oz (93.2 kg)   SpO2 98%   BMI 31.93 kg/m   Wt Readings from Last 3 Encounters:  11/24/21 205 lb 6 oz (93.2 kg)  09/14/21 210 lb 4 oz (95.4 kg)  12/01/20 206 lb 7 oz (93.6 kg)  Physical Exam Vitals and nursing note reviewed.  Constitutional:      General: He is not in acute distress.    Appearance: Normal appearance. He is well-developed. He is not ill-appearing.  HENT:     Head: Normocephalic and atraumatic.     Right Ear: Hearing, tympanic membrane, ear canal and external ear normal.     Left Ear: Hearing, tympanic membrane, ear canal and external ear normal.  Eyes:     General: No scleral icterus.    Extraocular Movements: Extraocular movements intact.     Conjunctiva/sclera: Conjunctivae normal.     Pupils: Pupils are equal, round, and reactive to light.  Neck:     Thyroid: No  thyroid mass or thyromegaly.  Cardiovascular:     Rate and Rhythm: Normal rate and regular rhythm.     Pulses: Normal pulses.          Radial pulses are 2+ on the right side and 2+ on the left side.     Heart sounds: Normal heart sounds. No murmur heard. Pulmonary:     Effort: Pulmonary effort is normal. No respiratory distress.     Breath sounds: Normal breath sounds. No wheezing, rhonchi or rales.  Abdominal:     General: Bowel sounds are normal. There is no distension.     Palpations: Abdomen is soft. There is no mass.     Tenderness: There is no abdominal tenderness. There is no guarding or rebound.     Hernia: No hernia is present.  Musculoskeletal:        General: Normal range of motion.     Cervical back: Normal range of motion and neck supple.     Right lower leg: No edema.     Left lower leg: No edema.  Lymphadenopathy:     Cervical: No cervical adenopathy.  Skin:    General: Skin is warm and dry.     Findings: No rash.  Neurological:     General: No focal deficit present.     Mental Status: He is alert and oriented to person, place, and time.  Psychiatric:        Mood and Affect: Mood normal.        Behavior: Behavior normal.        Thought Content: Thought content normal.        Judgment: Judgment normal.       Results for orders placed or performed in visit on 11/01/21  Basic metabolic panel  Result Value Ref Range   Glucose 85    Creatinine 1.0 0.6 - 1.3  Lipid panel  Result Value Ref Range   Triglycerides 94 40 - 160   Cholesterol 179 0 - 200   HDL 44 35 - 70   LDL Cholesterol 118   Hemoglobin A1c  Result Value Ref Range   Hemoglobin A1C 5.8    Lab Results  Component Value Date   TSH 3.680 12/01/2020    Assessment & Plan:   Problem List Items Addressed This Visit     Health maintenance examination - Primary (Chronic)    Preventative protocols reviewed and updated unless pt declined. Discussed healthy diet and lifestyle.       Dyslipidemia     Chronic, stable off medication. Levels actually improved over the past year.  The 10-year ASCVD risk score (Arnett DK, et al., 2019) is: 1.3%*   Values used to calculate the score:     Age: 72 years     Sex:  Male     Is Non-Hispanic African American: No     Diabetic: No     Tobacco smoker: No     Systolic Blood Pressure: 073 mmHg     Is BP treated: No     HDL Cholesterol: 44 mg/dL*     Total Cholesterol: 179 mg/dL*     * - Cholesterol units were assumed for this score calculation       Obesity, Class I, BMI 30-34.9    Encouraged healthy diet and lifestyle choices for sustainable weight loss. He is using [employer] through work.       Allergic rhinitis    Continue flonase, claritin, pseudophed      ADHD (attention deficit hyperactivity disorder), inattentive type    Stable period on strattera 80mg  daily - continue this.       Prediabetes    Encouraged limiting added sugars/sweetened beverages        Meds ordered this encounter  Medications   atomoxetine (STRATTERA) 80 MG capsule    Sig: Take 1 capsule (80 mg total) by mouth daily.    Dispense:  90 capsule    Refill:  3   No orders of the defined types were placed in this encounter.   Patient instructions: You are doing well today.  Strattera refilled.  Return as needed or in 1 year for next physical.   Follow up plan: Return in about 1 year (around 11/25/2022) for annual exam, prior fasting for blood work.  Ria Bush, MD

## 2021-11-24 NOTE — Assessment & Plan Note (Signed)
Chronic, stable off medication. Levels actually improved over the past year.  The 10-year ASCVD risk score (Arnett DK, et al., 2019) is: 1.3%*   Values used to calculate the score:     Age: 41 years     Sex: Male     Is Non-Hispanic African American: No     Diabetic: No     Tobacco smoker: No     Systolic Blood Pressure: 827 mmHg     Is BP treated: No     HDL Cholesterol: 44 mg/dL*     Total Cholesterol: 179 mg/dL*     * - Cholesterol units were assumed for this score calculation

## 2021-11-24 NOTE — Assessment & Plan Note (Signed)
Continue flonase, claritin, pseudophed

## 2021-11-24 NOTE — Patient Instructions (Addendum)
You are doing well today.  Strattera refilled.  Return as needed or in 1 year for next physical.   Health Maintenance, Male Adopting a healthy lifestyle and getting preventive care are important in promoting health and wellness. Ask your health care provider about: The right schedule for you to have regular tests and exams. Things you can do on your own to prevent diseases and keep yourself healthy. What should I know about diet, weight, and exercise? Eat a healthy diet  Eat a diet that includes plenty of vegetables, fruits, low-fat dairy products, and lean protein. Do not eat a lot of foods that are high in solid fats, added sugars, or sodium. Maintain a healthy weight Body mass index (BMI) is a measurement that can be used to identify possible weight problems. It estimates body fat based on height and weight. Your health care provider can help determine your BMI and help you achieve or maintain a healthy weight. Get regular exercise Get regular exercise. This is one of the most important things you can do for your health. Most adults should: Exercise for at least 150 minutes each week. The exercise should increase your heart rate and make you sweat (moderate-intensity exercise). Do strengthening exercises at least twice a week. This is in addition to the moderate-intensity exercise. Spend less time sitting. Even light physical activity can be beneficial. Watch cholesterol and blood lipids Have your blood tested for lipids and cholesterol at 43 years of age, then have this test every 5 years. You may need to have your cholesterol levels checked more often if: Your lipid or cholesterol levels are high. You are older than 43 years of age. You are at high risk for heart disease. What should I know about cancer screening? Many types of cancers can be detected early and may often be prevented. Depending on your health history and family history, you may need to have cancer screening at various  ages. This may include screening for: Colorectal cancer. Prostate cancer. Skin cancer. Lung cancer. What should I know about heart disease, diabetes, and high blood pressure? Blood pressure and heart disease High blood pressure causes heart disease and increases the risk of stroke. This is more likely to develop in people who have high blood pressure readings or are overweight. Talk with your health care provider about your target blood pressure readings. Have your blood pressure checked: Every 3-5 years if you are 36-13 years of age. Every year if you are 81 years old or older. If you are between the ages of 52 and 44 and are a current or former smoker, ask your health care provider if you should have a one-time screening for abdominal aortic aneurysm (AAA). Diabetes Have regular diabetes screenings. This checks your fasting blood sugar level. Have the screening done: Once every three years after age 38 if you are at a normal weight and have a low risk for diabetes. More often and at a younger age if you are overweight or have a high risk for diabetes. What should I know about preventing infection? Hepatitis B If you have a higher risk for hepatitis B, you should be screened for this virus. Talk with your health care provider to find out if you are at risk for hepatitis B infection. Hepatitis C Blood testing is recommended for: Everyone born from [birth year] through 1965. Anyone with known risk factors for hepatitis C. Sexually transmitted infections (STIs) You should be screened each year for STIs, including gonorrhea and chlamydia, if: You  are sexually active and are younger than 44 years of age. You are older than 43 years of age and your health care provider tells you that you are at risk for this type of infection. Your sexual activity has changed since you were last screened, and you are at increased risk for chlamydia or gonorrhea. Ask your health care provider if you are at risk. Ask  your health care provider about whether you are at high risk for HIV. Your health care provider may recommend a prescription medicine to help prevent HIV infection. If you choose to take medicine to prevent HIV, you should first get tested for HIV. You should then be tested every 3 months for as long as you are taking the medicine. Follow these instructions at home: Alcohol use Do not drink alcohol if your health care provider tells you not to drink. If you drink alcohol: Limit how much you have to 0-2 drinks a day. Know how much alcohol is in your drink. In the [place] one drink equals one 12 oz bottle of beer (355 mL), one 5 oz glass of wine (148 mL), or one 1 oz glass of hard liquor (44 mL). Lifestyle Do not use any products that contain nicotine or tobacco. These products include cigarettes, chewing tobacco, and vaping devices, such as e-cigarettes. If you need help quitting, ask your health care provider. Do not use street drugs. Do not share needles. Ask your health care provider for help if you need support or information about quitting drugs. General instructions Schedule regular health, dental, and eye exams. Stay current with your vaccines. Tell your health care provider if: You often feel depressed. You have ever been abused or do not feel safe at home. Summary Adopting a healthy lifestyle and getting preventive care are important in promoting health and wellness. Follow your health care provider's instructions about healthy diet, exercising, and getting tested or screened for diseases. Follow your health care provider's instructions on monitoring your cholesterol and blood pressure. This information is not intended to replace advice given to you by your health care provider. Make sure you discuss any questions you have with your health care provider. Document Revised: 07/13/2020 Document Reviewed: 07/13/2020 Elsevier Patient Education  2023 [employer].

## 2021-12-03 ENCOUNTER — Encounter: Payer: Managed Care, Other (non HMO) | Admitting: Family Medicine

## 2021-12-16 IMAGING — DX DG KNEE COMPLETE 4+V*R*
6 series · 6 of 6 positions shown · non-contrast
Comparison: None.

CLINICAL DATA: Pain at distal medial femur

EXAM:
RIGHT KNEE - COMPLETE 4+ VIEW

[knee ap]
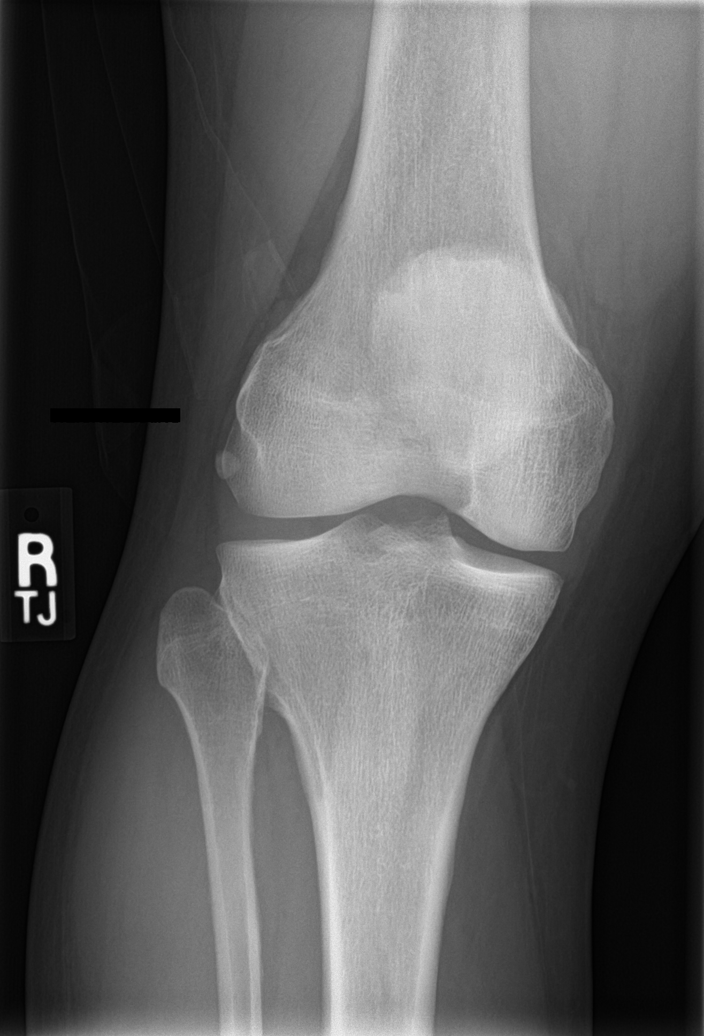

[knee tunnel]
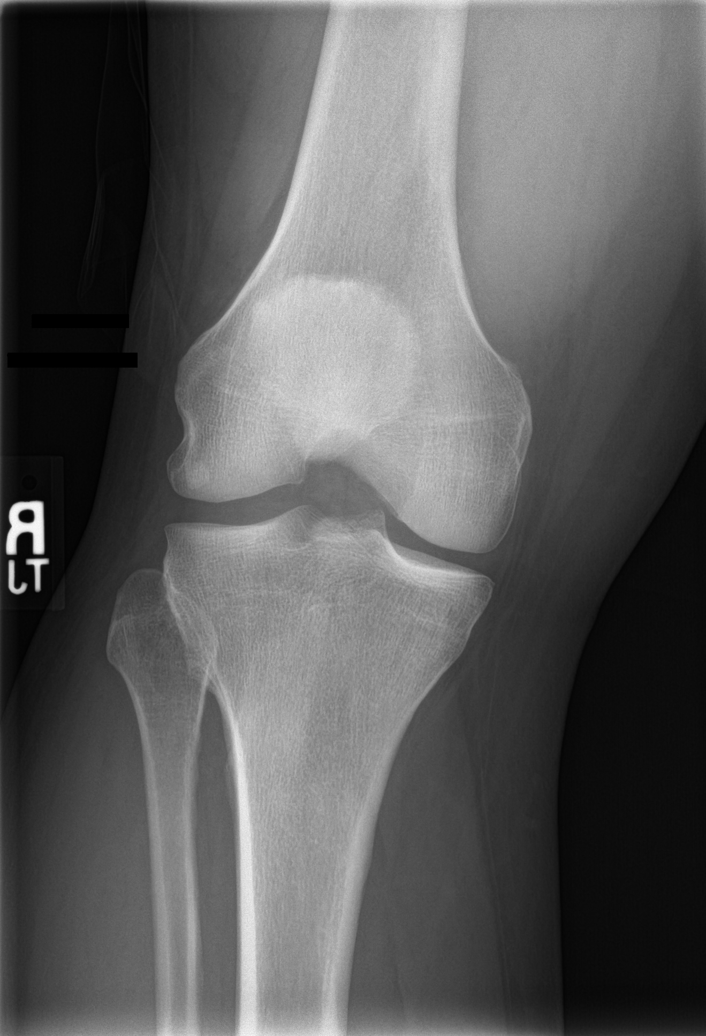

[knee lat]
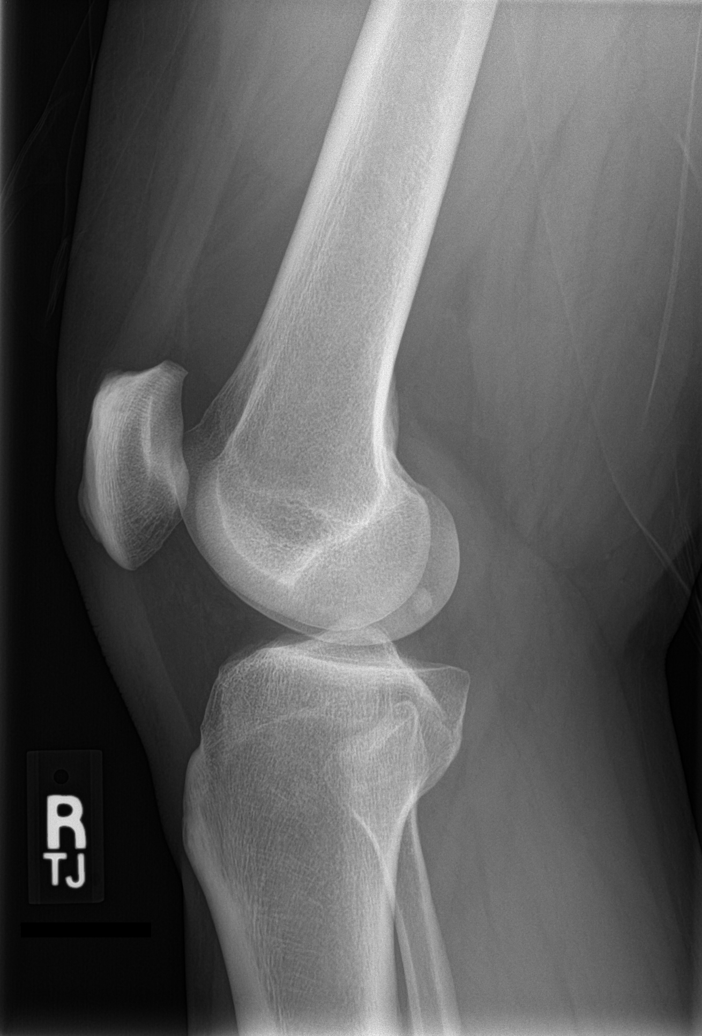

[patella skyline]
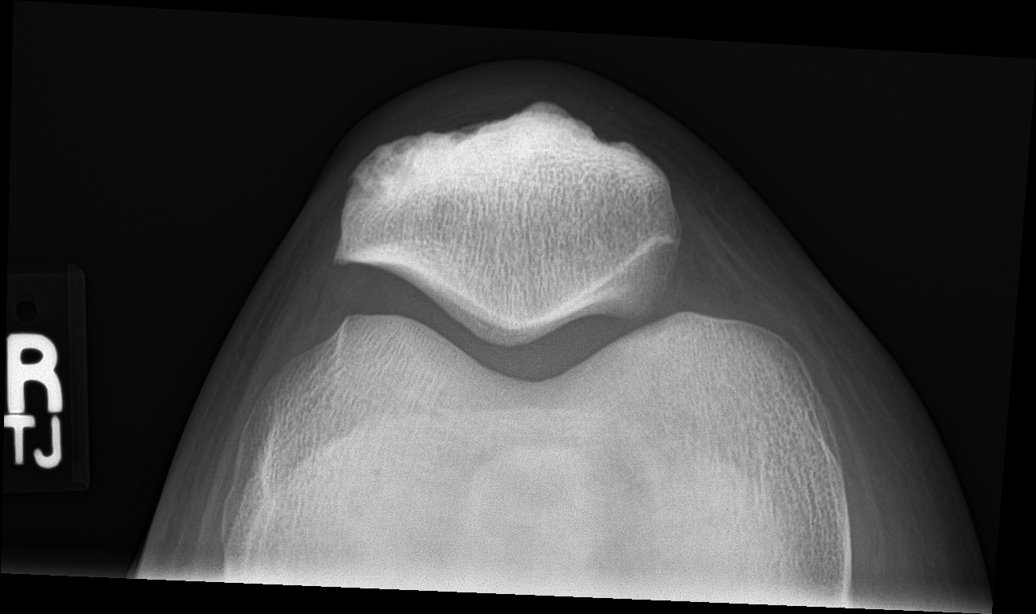

[knee obl (1 of 2)]
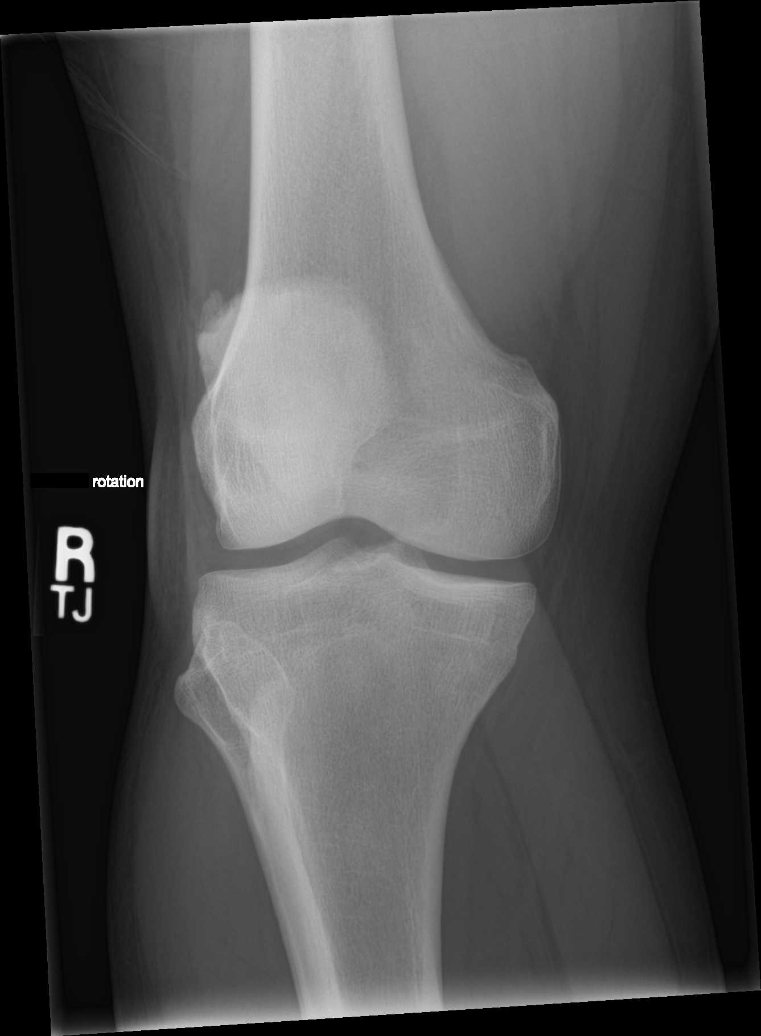

[knee obl (2 of 2)]
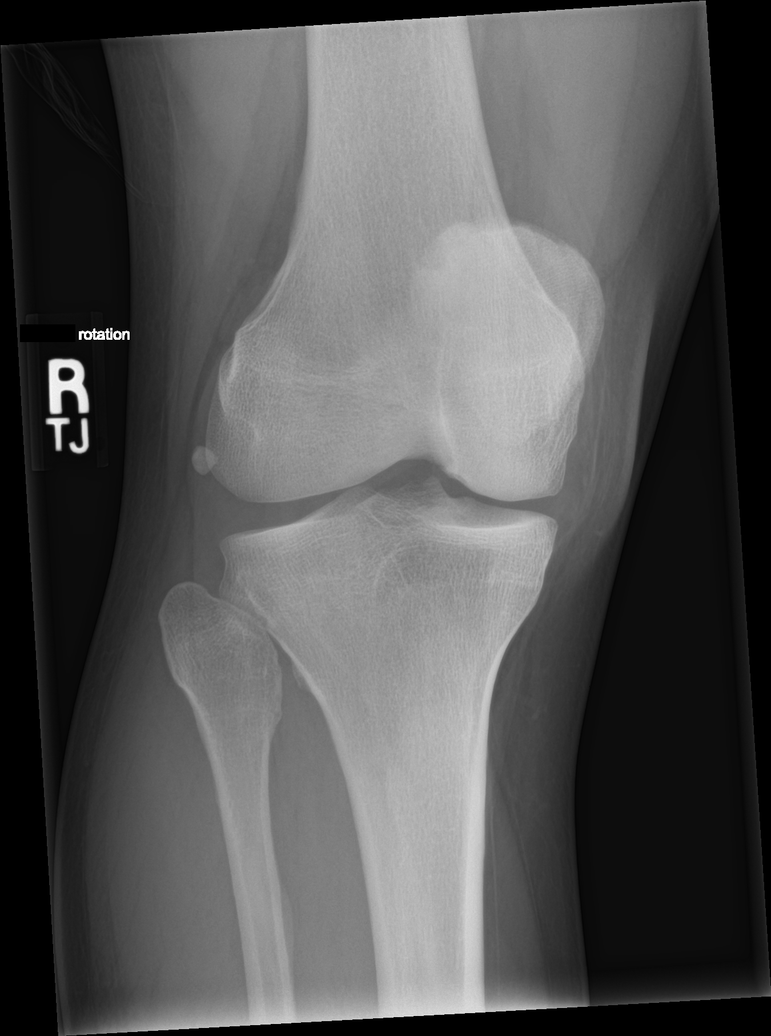

[6 of 6 positions shown; findings below may reference images not displayed]

FINDINGS: No evidence of fracture, dislocation, or joint effusion. No evidence
of arthropathy or other focal bone abnormality. Soft tissues are
unremarkable.
IMPRESSION: Negative.

## 2022-01-20 ENCOUNTER — Encounter: Payer: Self-pay | Admitting: Family Medicine

## 2022-01-20 ENCOUNTER — Ambulatory Visit: Payer: Managed Care, Other (non HMO) | Admitting: Family Medicine

## 2022-01-20 VITALS — BP 106/72 | HR 76 | Temp 98.0°F | Ht 67.5 in | Wt 210.1 lb

## 2022-01-20 DIAGNOSIS — M5412 Radiculopathy, cervical region: Secondary | ICD-10-CM | POA: Diagnosis not present

## 2022-01-20 MED ORDER — PREDNISONE 20 MG PO TABS: ORAL_TABLET | ORAL | 0 refills | Status: DC

## 2022-01-20 NOTE — Progress Notes (Signed)
Gerald Crabbe T. Reeve Mallo, MD, CAQ Sports Medicine [facility] [address] [place], [address]  Phone: [phone]  FAX: [phone]  Gerald [name] - 41 y.o. male  MRN [id number]  Date of Birth: May 26, 1980  Date: 01/20/2022  PCP: Eustaquio Boyden, MD  Referral: Eustaquio Boyden, MD  Chief Complaint  Patient presents with   Shoulder Pain    Left   Numbness    Down Arm   Subjective:   Gerald [name] is a 41 y.o. very pleasant male patient with Body mass index is 32.42 kg/m. who presents with the following:  He presents with left-sided shoulder pain as well as numbness down his left arm.  Notable neck pain.  A couple of weeks ago, woke up and now it is really hurting very bad.  Some shooting pains down the left arm.   Will come and go, and then it will flare up.  Tingling in shoulder blades, elbow, and down to the fingertips.   Movement of the neck will make it hurt more, and he can induce his own radiculopathy.  Some radicular pain down the arm.   Already made PT appointment -it is for 2 weeks from now.  He has not had any weakness or focal grip changes.  No weakness whatsoever in the left upper extremity.  Review of Systems is noted in the HPI, as appropriate  Objective:   BP 106/72   Pulse 76   Temp 98 F (36.7 C) (Oral)   Ht 5' 7.5" (1.715 m)   Wt 210 lb 2 oz (95.3 kg)   SpO2 95%   BMI 32.42 kg/m   GEN: No acute distress; alert,appropriate. PULM: Breathing comfortably in no respiratory distress PSYCH: Normally interactive.    GEN: alert,appropriate PSYCH: Normally interactive. Cooperative during the interview.   CERVICAL SPINE EXAM Range of motion: Flexion, extension, lateral bending, and rotation: Roughly 20% loss of motion in all directions, and he is able to induce a positive Spurling's maneuver with self movement of the neck and extension. Pain with terminal motion: Yes Spinous Processes: NT SCM: NT Upper  paracervical muscles: Tender Upper traps: NT C5-T1 intact, sensation and motor    Shoulder: L Inspection: No muscle wasting or winging Ecchymosis/edema: neg  AC joint, scapula, clavicle: NT Abduction: full, 5/5 Flexion: full, 5/5 IR, full, lift-off: 5/5 ER at neutral: full, 5/5 AC crossover and compression: neg Neer: neg Hawkins: neg Drop Test: neg Empty Can: neg Supraspinatus insertion: NT Bicipital groove: NT Speed's: neg Yergason's: neg Sulcus sign: neg Scapular dyskinesis: none C5-T1 intact Sensation intact Grip 5/5   Laboratory and Imaging Data:  Assessment and Plan:     ICD-10-CM   1. Left cervical radiculopathy  M54.12      Acute on chronic neck pain with exacerbation.  New onset radicular symptoms down the left side of his arm.  He does have some tingling associated with it.  No focal weakness.  With this age, most likely discogenic origin of pain. discogenic origin of pain.  He already has physical therapy set up.  I am also going to give him a longer course of some steroids to take.  He will follow-up with me if his symptoms persist.  Medication Management during today's office visit: Meds ordered this encounter  Medications   predniSONE (DELTASONE) 20 MG tablet    Sig: 2 tabs po for 7 days, then 1 tab po for 7 days    Dispense:  21 tablet  Refill:  0   There are no discontinued medications.  Orders placed today for conditions managed today: No orders of the defined types were placed in this encounter.   Disposition: No follow-ups on file.  Dr[employer] One speech-to-text software was used for transcription in this dictation.  Possible transcriptional errors can occur using Animal nutritionist.   Signed,  Elpidio Galea. Natia Fahmy, MD   Outpatient Encounter Medications as of 01/20/2022  Medication Sig   atomoxetine (STRATTERA) 80 MG capsule Take 1 capsule (80 mg total) by mouth daily.   fluticasone (FLONASE) 50 MCG/ACT nasal spray Place 2 sprays into both nostrils daily as  needed for allergies or rhinitis.   loratadine (CLARITIN) 10 MG tablet Take 10 mg by mouth as needed.     Multiple Vitamin (MULTIVITAMIN) tablet Take 1 tablet by mouth daily.   predniSONE (DELTASONE) 20 MG tablet 2 tabs po for 7 days, then 1 tab po for 7 days   pseudoephedrine (SUDAFED) 30 MG tablet Take 30 mg by mouth every 4 (four) hours as needed for congestion.   No facility-administered encounter medications on file as of 01/20/2022.

## 2022-04-04 ENCOUNTER — Telehealth: Payer: Managed Care, Other (non HMO)

## 2022-04-13 ENCOUNTER — Encounter: Payer: Self-pay | Admitting: Family Medicine

## 2022-04-13 DIAGNOSIS — M5412 Radiculopathy, cervical region: Secondary | ICD-10-CM

## 2022-04-13 DIAGNOSIS — R200 Anesthesia of skin: Secondary | ICD-10-CM

## 2022-05-06 HISTORY — PX: CERVICAL DISC SURGERY: SHX588

## 2022-10-06 LAB — LIPID PANEL
Cholesterol: 171 (ref 0–200)
HDL: 41 (ref 35–70)
LDL Cholesterol: 105
Triglycerides: 138 (ref 40–160)

## 2022-10-06 LAB — COMPREHENSIVE METABOLIC PANEL: eGFR: 95

## 2022-10-06 LAB — BASIC METABOLIC PANEL
Creatinine: 1 (ref 0.6–1.3)
Glucose: 98

## 2022-10-06 LAB — HEMOGLOBIN A1C: Hemoglobin A1C: 5.7

## 2022-10-07 ENCOUNTER — Encounter: Payer: Self-pay | Admitting: Family Medicine

## 2023-02-05 ENCOUNTER — Other Ambulatory Visit: Payer: Self-pay | Admitting: Family Medicine

## 2023-02-05 DIAGNOSIS — E785 Hyperlipidemia, unspecified: Secondary | ICD-10-CM

## 2023-02-05 DIAGNOSIS — Z1159 Encounter for screening for other viral diseases: Secondary | ICD-10-CM

## 2023-02-05 DIAGNOSIS — E041 Nontoxic single thyroid nodule: Secondary | ICD-10-CM

## 2023-02-05 DIAGNOSIS — R7303 Prediabetes: Secondary | ICD-10-CM

## 2023-02-09 ENCOUNTER — Other Ambulatory Visit (INDEPENDENT_AMBULATORY_CARE_PROVIDER_SITE_OTHER): Payer: Managed Care, Other (non HMO)

## 2023-02-09 DIAGNOSIS — R7303 Prediabetes: Secondary | ICD-10-CM

## 2023-02-09 DIAGNOSIS — E785 Hyperlipidemia, unspecified: Secondary | ICD-10-CM

## 2023-02-09 DIAGNOSIS — Z1159 Encounter for screening for other viral diseases: Secondary | ICD-10-CM | POA: Diagnosis not present

## 2023-02-09 DIAGNOSIS — E041 Nontoxic single thyroid nodule: Secondary | ICD-10-CM

## 2023-02-09 NOTE — Addendum Note (Signed)
Addended by: Lovena Neighbours on: 02/09/2023 09:29 AM   Modules accepted: Orders

## 2023-02-10 LAB — LIPID PANEL
Chol/HDL Ratio: 4.4 {ratio} (ref 0.0–5.0)
Cholesterol, Total: 189 mg/dL (ref 100–199)
HDL: 43 mg/dL (ref >39)
LDL Chol Calc (NIH): 123 mg/dL — ABNORMAL HIGH (ref 0–99)
Triglycerides: 127 mg/dL (ref 0–149)
VLDL Cholesterol Cal: 23 mg/dL (ref 5–40)

## 2023-02-10 LAB — COMPREHENSIVE METABOLIC PANEL
ALT: 44 [IU]/L (ref 0–44)
AST: 23 [IU]/L (ref 0–40)
Albumin: 4.4 g/dL (ref 4.1–5.1)
Alkaline Phosphatase: 89 [IU]/L (ref 44–121)
BUN/Creatinine Ratio: 11 (ref 9–20)
BUN: 12 mg/dL (ref 6–24)
Bilirubin Total: 1.1 mg/dL (ref 0.0–1.2)
CO2: 24 mmol/L (ref 20–29)
Calcium: 9.5 mg/dL (ref 8.7–10.2)
Chloride: 103 mmol/L (ref 96–106)
Creatinine, Ser: 1.05 mg/dL (ref 0.76–1.27)
Globulin, Total: 2.8 g/dL (ref 1.5–4.5)
Glucose: 103 mg/dL — ABNORMAL HIGH (ref 70–99)
Potassium: 4.9 mmol/L (ref 3.5–5.2)
Sodium: 143 mmol/L (ref 134–144)
Total Protein: 7.2 g/dL (ref 6.0–8.5)
eGFR: 90 mL/min/{1.73_m2} (ref >59)

## 2023-02-10 LAB — HEMOGLOBIN A1C
Est. average glucose Bld gHb Est-mCnc: 128 mg/dL
Hgb A1c MFr Bld: 6.1 % — ABNORMAL HIGH (ref 4.8–5.6)

## 2023-02-10 LAB — HEPATITIS C ANTIBODY: Hep C Virus Ab: NONREACTIVE

## 2023-02-10 LAB — TSH: TSH: 3.48 u[IU]/mL (ref 0.450–4.500)

## 2023-02-15 ENCOUNTER — Encounter: Payer: Self-pay | Admitting: Family Medicine

## 2023-02-15 ENCOUNTER — Ambulatory Visit: Payer: Managed Care, Other (non HMO) | Admitting: Family Medicine

## 2023-02-15 VITALS — BP 130/84 | HR 72 | Temp 98.2°F | Ht 67.5 in | Wt 211.4 lb

## 2023-02-15 DIAGNOSIS — E785 Hyperlipidemia, unspecified: Secondary | ICD-10-CM | POA: Diagnosis not present

## 2023-02-15 DIAGNOSIS — Z0000 Encounter for general adult medical examination without abnormal findings: Secondary | ICD-10-CM | POA: Diagnosis not present

## 2023-02-15 DIAGNOSIS — Z8249 Family history of ischemic heart disease and other diseases of the circulatory system: Secondary | ICD-10-CM

## 2023-02-15 DIAGNOSIS — F900 Attention-deficit hyperactivity disorder, predominantly inattentive type: Secondary | ICD-10-CM

## 2023-02-15 DIAGNOSIS — J302 Other seasonal allergic rhinitis: Secondary | ICD-10-CM

## 2023-02-15 DIAGNOSIS — E66811 Obesity, class 1: Secondary | ICD-10-CM

## 2023-02-15 DIAGNOSIS — R7303 Prediabetes: Secondary | ICD-10-CM

## 2023-02-15 MED ORDER — BUPROPION HCL ER (SR) 100 MG PO TB12: 100.0000 mg | ORAL_TABLET | Freq: Every day | ORAL | 11 refills | Status: DC

## 2023-02-15 NOTE — Assessment & Plan Note (Signed)
Sister with protein S deficiency. Pt tested negative for this.

## 2023-02-15 NOTE — Progress Notes (Signed)
Ph: [phone] Fax: [phone]   Patient ID: Gerald [name], male    DOB: [birth year], 42 y.o.   MRN: [id number]  This visit was conducted in person.  BP 130/84   Pulse 72   Temp 98.2 F (36.8 C) (Oral)   Ht 5' 7.5" (1.715 m)   Wt 211 lb 6 oz (95.9 kg)   SpO2 94%   BMI 32.62 kg/m    CC: CPE Subjective:   HPI: Gerald [name] is a 42 y.o. male presenting on 02/15/2023 for Annual Exam (Wants to discuss Strattera. )   He is president of [employer]  ADHD - h/o childhood combined ADHD treated with stimulants. No childhood records available. Has been on Strattera 80mg  daily, stopped several months ago as it may have lost effect. Does not want to try Wellbutrin. Felt "like a zombie" when on Ritalin as a child.    Had C6/7 cervical neck surgery with disc replacement 05/2022 by [facility] Dr Noralyn Pick in [place]. Significant relief after this.   Preventative: Flu shot - declined COVID series - Pfizer 06/2019 x2, no booster  Td - 1995, 03/2010  Seat belt use discussed.  Sunscreen use discussed. No changing moles.  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  Activity: enjoys golf, cutting the lawn, weight machine  Diet: water, good vegetables, limits sweetened beverages     Relevant past medical, surgical, family and social history reviewed and updated as indicated. Interim medical history since our last visit reviewed. Allergies and medications reviewed and updated. Outpatient Medications Prior to Visit  Medication Sig Dispense Refill   fluticasone (FLONASE) 50 MCG/ACT nasal spray Place 2 sprays into both nostrils daily as needed for allergies or rhinitis. 16 g 6   loratadine (CLARITIN) 10 MG tablet Take 10 mg by mouth as needed.       Multiple Vitamin (MULTIVITAMIN) tablet Take 1 tablet by mouth  daily.     pseudoephedrine (SUDAFED) 30 MG tablet Take 30 mg by mouth every 4 (four) hours as needed for congestion.     atomoxetine (STRATTERA) 80 MG capsule Take 1 capsule (80 mg total) by mouth daily. (Patient not taking: Reported on 02/15/2023) 90 capsule 3   predniSONE (DELTASONE) 20 MG tablet 2 tabs po for 7 days, then 1 tab po for 7 days 21 tablet 0   No facility-administered medications prior to visit.     Per HPI unless specifically indicated in ROS section below Review of Systems  Constitutional:  Negative for activity change, appetite change, chills, fatigue, fever and unexpected weight change.  HENT:  Negative for hearing loss.   Eyes:  Negative for visual disturbance.  Respiratory:  Negative for cough, chest tightness, shortness of breath and wheezing.   Cardiovascular:  Negative for chest pain, palpitations and leg swelling.  Gastrointestinal:  Negative for abdominal distention, abdominal pain, blood in stool, constipation, diarrhea, nausea and vomiting.  Genitourinary:  Negative for difficulty urinating and hematuria.  Musculoskeletal:  Negative for arthralgias, myalgias and neck pain.  Skin:  Negative for rash.  Neurological:  Negative for dizziness, seizures, syncope and headaches.  Hematological:  Negative for adenopathy. Does not bruise/bleed easily.  Psychiatric/Behavioral:  Negative for dysphoric mood. The patient is not nervous/anxious.     Objective:  BP 130/84   Pulse 72  Temp 98.2 F (36.8 C) (Oral)   Ht 5' 7.5" (1.715 m)   Wt 211 lb 6 oz (95.9 kg)   SpO2 94%   BMI 32.62 kg/m   Wt Readings from Last 3 Encounters:  02/15/23 211 lb 6 oz (95.9 kg)  01/20/22 210 lb 2 oz (95.3 kg)  11/24/21 205 lb 6 oz (93.2 kg)      Physical Exam Vitals and nursing note reviewed.  Constitutional:      General: He is not in acute distress.    Appearance: Normal appearance. He is well-developed. He is not ill-appearing.  HENT:     Head: Normocephalic and atraumatic.      Right Ear: Hearing, tympanic membrane, ear canal and external ear normal.     Left Ear: Hearing, tympanic membrane, ear canal and external ear normal.     Nose: Nose normal.     Mouth/Throat:     Mouth: Mucous membranes are moist.     Pharynx: Oropharynx is clear. No oropharyngeal exudate or posterior oropharyngeal erythema.  Eyes:     General: No scleral icterus.    Extraocular Movements: Extraocular movements intact.     Conjunctiva/sclera: Conjunctivae normal.     Pupils: Pupils are equal, round, and reactive to light.  Neck:     Thyroid: No thyroid mass or thyromegaly.  Cardiovascular:     Rate and Rhythm: Normal rate and regular rhythm.     Pulses: Normal pulses.          Radial pulses are 2+ on the right side and 2+ on the left side.     Heart sounds: Normal heart sounds. No murmur heard. Pulmonary:     Effort: Pulmonary effort is normal. No respiratory distress.     Breath sounds: Normal breath sounds. No wheezing, rhonchi or rales.  Abdominal:     General: Bowel sounds are normal. There is no distension.     Palpations: Abdomen is soft. There is no mass.     Tenderness: There is no abdominal tenderness. There is no guarding or rebound.     Hernia: No hernia is present.  Musculoskeletal:        General: Normal range of motion.     Cervical back: Normal range of motion and neck supple.     Right lower leg: No edema.     Left lower leg: No edema.  Lymphadenopathy:     Cervical: No cervical adenopathy.  Skin:    General: Skin is warm and dry.     Findings: No rash.  Neurological:     General: No focal deficit present.     Mental Status: He is alert and oriented to person, place, and time.  Psychiatric:        Mood and Affect: Mood normal.        Behavior: Behavior normal.        Thought Content: Thought content normal.        Judgment: Judgment normal.       Results for orders placed or performed in visit on 02/09/23  Comprehensive metabolic panel  Result  Value Ref Range   Glucose 103 (H) 70 - 99 mg/dL   BUN 12 6 - 24 mg/dL   Creatinine, Ser 0.10 0.76 - 1.27 mg/dL   eGFR 90 >27 OZ/DGU/4.40   BUN/Creatinine Ratio 11 9 - 20   Sodium 143 134 - 144 mmol/L   Potassium 4.9 3.5 - 5.2 mmol/L   Chloride 103 96 - 106 mmol/L  CO2 24 20 - 29 mmol/L   Calcium 9.5 8.7 - 10.2 mg/dL   Total Protein 7.2 6.0 - 8.5 g/dL   Albumin 4.4 4.1 - 5.1 g/dL   Globulin, Total 2.8 1.5 - 4.5 g/dL   Bilirubin Total 1.1 0.0 - 1.2 mg/dL   Alkaline Phosphatase 89 44 - 121 IU/L   AST 23 0 - 40 IU/L   ALT 44 0 - 44 IU/L  Hemoglobin A1c  Result Value Ref Range   Hgb A1c MFr Bld 6.1 (H) 4.8 - 5.6 %   Est. average glucose Bld gHb Est-mCnc 128 mg/dL  Lipid panel  Result Value Ref Range   Cholesterol, Total 189 100 - 199 mg/dL   Triglycerides 161 0 - 149 mg/dL   HDL 43 >09 mg/dL   VLDL Cholesterol Cal 23 5 - 40 mg/dL   LDL Chol Calc (NIH) 604 (H) 0 - 99 mg/dL   Chol/HDL Ratio 4.4 0.0 - 5.0 ratio  TSH  Result Value Ref Range   TSH 3.480 0.450 - 4.500 uIU/mL  Hepatitis C antibody  Result Value Ref Range   Hep C Virus Ab Non Reactive Non Reactive    Assessment & Plan:   Problem List Items Addressed This Visit     Health maintenance examination - Primary (Chronic)    Preventative protocols reviewed and updated unless pt declined. Discussed healthy diet and lifestyle.       Dyslipidemia    Mild, off med. Reviewed diet choices to improve cholesterol. The 10-year ASCVD risk score (Arnett DK, et al., 2019) is: 2%   Values used to calculate the score:     Age: 79 years     Sex: Male     Is Non-Hispanic African American: No     Diabetic: No     Tobacco smoker: No     Systolic Blood Pressure: 130 mmHg     Is BP treated: No     HDL Cholesterol: 43 mg/dL     Total Cholesterol: 189 mg/dL       Obesity, Class I, BMI 30-34.9    Continue to encourage healthy diet and lifestyle choices.       Allergic rhinitis    Continues PRN OTC meds as per instructions       ADHD (attention deficit hyperactivity disorder), inattentive type    Off Strattera - feels it lost effect.  Discussed options namely wellbutrin vs stimulant.  He felt he didn't tolerate stimulants as a child.  Desires to try Wellbutrin - will start SR 100mg  once daily in am. Update with effect. Reviewed possible side effects to watch for. Update with effect.       Prediabetes    Limit added sugar in diet      Family history of MI (myocardial infarction)    Sister with protein S deficiency. Pt tested negative for this.         Meds ordered this encounter  Medications   buPROPion ER (WELLBUTRIN SR) 100 MG 12 hr tablet    Sig: Take 1 tablet (100 mg total) by mouth daily.    Dispense:  30 tablet    Refill:  11    No orders of the defined types were placed in this encounter.   Patient Instructions  Stay off Strattera. Consider Wellbutrin mood medicine to help with ADHD. We could consider retrial stimulant like Ritalin or Adderall.  Good to see you today Return as needed or in 1 year for next physical.  Follow up plan: Return in about 1 year (around 02/15/2024) for annual exam, prior fasting for blood work.  Eustaquio Boyden, MD

## 2023-02-15 NOTE — Assessment & Plan Note (Signed)
Continue to encourage healthy diet and lifestyle choices.  

## 2023-02-15 NOTE — Patient Instructions (Addendum)
Stay off Strattera. Consider Wellbutrin mood medicine to help with ADHD. We could consider retrial stimulant like Ritalin or Adderall.  Good to see you today Return as needed or in 1 year for next physical.

## 2023-02-15 NOTE — Assessment & Plan Note (Signed)
Off Strattera - feels it lost effect.  Discussed options namely wellbutrin vs stimulant.  He felt he didn't tolerate stimulants as a child.  Desires to try Wellbutrin - will start SR 100mg  once daily in am. Update with effect. Reviewed possible side effects to watch for. Update with effect.

## 2023-02-15 NOTE — Assessment & Plan Note (Signed)
Limit added sugar in diet.

## 2023-02-15 NOTE — Assessment & Plan Note (Signed)
Mild, off med. Reviewed diet choices to improve cholesterol. The 10-year ASCVD risk score (Arnett DK, et al., 2019) is: 2%   Values used to calculate the score:     Age: 42 years     Sex: Male     Is Non-Hispanic African American: No     Diabetic: No     Tobacco smoker: No     Systolic Blood Pressure: 130 mmHg     Is BP treated: No     HDL Cholesterol: 43 mg/dL     Total Cholesterol: 189 mg/dL

## 2023-02-15 NOTE — Assessment & Plan Note (Signed)
Preventative protocols reviewed and updated unless pt declined. Discussed healthy diet and lifestyle.  

## 2023-02-15 NOTE — Assessment & Plan Note (Addendum)
Continues PRN OTC meds as per instructions

## 2023-03-14 ENCOUNTER — Other Ambulatory Visit: Payer: Self-pay | Admitting: Family Medicine

## 2023-03-16 NOTE — Telephone Encounter (Signed)
 ERx

## 2023-07-03 ENCOUNTER — Encounter: Payer: Self-pay | Admitting: Family Medicine

## 2023-07-03 DIAGNOSIS — F900 Attention-deficit hyperactivity disorder, predominantly inattentive type: Secondary | ICD-10-CM

## 2023-07-03 MED ORDER — BUPROPION HCL ER (SR) 100 MG PO TB12: 100.0000 mg | ORAL_TABLET | Freq: Every day | ORAL | 2 refills | Status: DC

## 2023-07-03 NOTE — Telephone Encounter (Signed)
 Updated pt's pharmacy list. E-scribed rx to [facility].

## 2023-11-10 ENCOUNTER — Encounter: Payer: Self-pay | Admitting: Family Medicine

## 2023-11-10 ENCOUNTER — Ambulatory Visit: Admitting: Family Medicine

## 2023-11-10 VITALS — BP 110/82 | HR 90 | Temp 98.0°F | Ht 67.5 in | Wt 212.2 lb

## 2023-11-10 DIAGNOSIS — F418 Other specified anxiety disorders: Secondary | ICD-10-CM | POA: Diagnosis not present

## 2023-11-10 DIAGNOSIS — F900 Attention-deficit hyperactivity disorder, predominantly inattentive type: Secondary | ICD-10-CM

## 2023-11-10 MED ORDER — LORAZEPAM 0.5 MG PO TABS
0.2500 mg | ORAL_TABLET | Freq: Two times a day (BID) | ORAL | 0 refills | Status: AC | PRN
Start: 2023-11-10 — End: ?

## 2023-11-10 NOTE — Patient Instructions (Signed)
 I will work on work accomodation forms and fax in when complete.  We will let you know when we send them in.  Continue wellbutrin  SR 100mg  daily  May try lorazepam  1/2 tablet at a time as needed for anxiety.

## 2023-11-10 NOTE — Progress Notes (Signed)
 Ph: [phone] Fax: [phone]   Patient ID: Gerald [name], male    DOB: 11-12-1980, 43 y.o.   MRN: [id number]  This visit was conducted in person.  BP 110/82   Pulse 90   Temp 98 F (36.7 C) (Oral)   Ht 5' 7.5 (1.715 m)   Wt 212 lb 4 oz (96.3 kg)   SpO2 97%   BMI 32.75 kg/m    CC: discuss work stressors  Subjective:   HPI: Gerald [name] is a 43 y.o. male presenting on 11/10/2023 for Anxiety (Pt states he needs Accomodation medical assessment form filled. Having anxiety about work changes. )   Works for [employer]/b[employer] - they are having everyone return to office for work - phased approach started 03/2024.  Stressful situation at work 2019 - developed PTSD type symptoms from this, transferred to a different department.   Ever since discussion being held in his department, he notes he develops anxiety over this - presenting with tachy palpitations, diaphoresis, hands shaking.  No chest pain, abd pain, nausea.  He doesn't notice difficulty with public speaking or engaging with the public outside of work.   Worried about difficulty controlling ADHD with return to work.   ADHD - on wellbutrin  SR 100mg  daily.   Requesting workplace accomodation to stay working remote.      Relevant past medical, surgical, family and social history reviewed and updated as indicated. Interim medical history since our last visit reviewed. Allergies and medications reviewed and updated. Outpatient Medications Prior to Visit  Medication Sig Dispense Refill   buPROPion  ER (WELLBUTRIN  SR) 100 MG 12 hr tablet Take 1 tablet (100 mg total) by mouth daily. 90 tablet 2   fluticasone  (FLONASE ) 50 MCG/ACT nasal spray Place 2 sprays into both nostrils daily as needed for allergies or rhinitis. 16 g 6   loratadine (CLARITIN) 10 MG tablet Take 10 mg by mouth as needed.       Multiple Vitamin (MULTIVITAMIN) tablet Take 1 tablet by mouth daily.     pseudoephedrine (SUDAFED) 30 MG tablet Take 30 mg  by mouth every 4 (four) hours as needed for congestion.     No facility-administered medications prior to visit.     Per HPI unless specifically indicated in ROS section below Review of Systems  Objective:  BP 110/82   Pulse 90   Temp 98 F (36.7 C) (Oral)   Ht 5' 7.5 (1.715 m)   Wt 212 lb 4 oz (96.3 kg)   SpO2 97%   BMI 32.75 kg/m   Wt Readings from Last 3 Encounters:  11/10/23 212 lb 4 oz (96.3 kg)  02/15/23 211 lb 6 oz (95.9 kg)  01/20/22 210 lb 2 oz (95.3 kg)      Physical Exam Vitals and nursing note reviewed.  Constitutional:      Appearance: Normal appearance. He is not ill-appearing.  HENT:     Mouth/Throat:     Mouth: Mucous membranes are moist.     Pharynx: Oropharynx is clear. No oropharyngeal exudate or posterior oropharyngeal erythema.  Eyes:     Extraocular Movements: Extraocular movements intact.     Conjunctiva/sclera: Conjunctivae normal.     Pupils: Pupils are equal, round, and reactive to light.  Cardiovascular:     Rate and Rhythm: Normal rate and regular rhythm.     Pulses: Normal pulses.     Heart sounds: Normal heart sounds. No murmur heard. Pulmonary:     Effort: Pulmonary effort is  normal. No respiratory distress.     Breath sounds: Normal breath sounds. No wheezing, rhonchi or rales.  Musculoskeletal:     Cervical back: Normal range of motion and neck supple.     Right lower leg: No edema.     Left lower leg: No edema.  Neurological:     Mental Status: He is alert.  Psychiatric:        Mood and Affect: Mood normal.        Behavior: Behavior normal.       Results for orders placed or performed in visit on 02/09/23  Comprehensive metabolic panel   Collection Time: 02/09/23  9:29 AM  Result Value Ref Range   Glucose 103 (H) 70 - 99 mg/dL   BUN 12 6 - 24 mg/dL   Creatinine, Ser 8.94 0.76 - 1.27 mg/dL   eGFR 90 >40 fO/fpw/8.26   BUN/Creatinine Ratio 11 9 - 20   Sodium 143 134 - 144 mmol/L   Potassium 4.9 3.5 - 5.2 mmol/L    Chloride 103 96 - 106 mmol/L   CO2 24 20 - 29 mmol/L   Calcium 9.5 8.7 - 10.2 mg/dL   Total Protein 7.2 6.0 - 8.5 g/dL   Albumin 4.4 4.1 - 5.1 g/dL   Globulin, Total 2.8 1.5 - 4.5 g/dL   Bilirubin Total 1.1 0.0 - 1.2 mg/dL   Alkaline Phosphatase 89 44 - 121 IU/L   AST 23 0 - 40 IU/L   ALT 44 0 - 44 IU/L  Hemoglobin A1c   Collection Time: 02/09/23  9:29 AM  Result Value Ref Range   Hgb A1c MFr Bld 6.1 (H) 4.8 - 5.6 %   Est. average glucose Bld gHb Est-mCnc 128 mg/dL  Lipid panel   Collection Time: 02/09/23  9:29 AM  Result Value Ref Range   Cholesterol, Total 189 100 - 199 mg/dL   Triglycerides 872 0 - 149 mg/dL   HDL 43 >60 mg/dL   VLDL Cholesterol Cal 23 5 - 40 mg/dL   LDL Chol Calc (NIH) 876 (H) 0 - 99 mg/dL   Chol/HDL Ratio 4.4 0.0 - 5.0 ratio  TSH   Collection Time: 02/09/23  9:29 AM  Result Value Ref Range   TSH 3.480 0.450 - 4.500 uIU/mL  Hepatitis C antibody   Collection Time: 02/09/23  9:29 AM  Result Value Ref Range   Hep C Virus Ab Non Reactive Non Reactive      11/10/2023    2:18 PM 02/15/2023   11:34 AM 11/24/2021    2:16 PM 12/01/2020    2:57 PM 08/13/2019    3:26 PM  Depression screen PHQ 2/9  Decreased Interest 0 0 0 0 0  Down, Depressed, Hopeless 0 0 0 0 0  PHQ - 2 Score 0 0 0 0 0  Altered sleeping 0 1     Tired, decreased energy 0 0     Change in appetite 0 0     Feeling bad or failure about yourself  0 1     Trouble concentrating 0 0     Moving slowly or fidgety/restless 0 0     Suicidal thoughts 0 0     PHQ-9 Score 0 2     Difficult doing work/chores Not difficult at all Not difficult at all          11/10/2023    2:19 PM 02/15/2023   11:34 AM  GAD 7 : Generalized Anxiety Score  Nervous, Anxious, on  Edge 1 1  Control/stop worrying 1 0  Worry too much - different things 1 1  Trouble relaxing 1 0  Restless 0 0  Easily annoyed or irritable 0 0  Afraid - awful might happen 0 0  Total GAD 7 Score 4 2  Anxiety Difficulty Not difficult at all Not  difficult at all   Assessment & Plan:   Problem List Items Addressed This Visit     ADHD (attention deficit hyperactivity disorder), inattentive type   Chronic, overall stable period on Wellbutrin  SR 100mg  daily non-stimulant regimen.  Straterra lost effect.       Situational anxiety - Primary   Significant anxiety stemming from stressful situation at work 2019 followed by COVID limitations.  Has been working from home which has been very effective for patient.  Now with looming return to work discussions notes limited anxiety attacks developing.  He has spoken with direct managers about work accomodation request and they appear on board.  He requests work accomodation forms filled out today which I will do.  Also reviewed  PRN anxiety medication for his symptoms. He feels hydroxyzine may be too sedating (given his response to benadryl) so will try limited use of PRN lorazepam .  Discussed pros and cons of controlled substances and expectations to receive prescription from our office. Discussed recommended short term use of med. Discussed risks of medication including dependence, tolerance, and addiction/abuse potential. If ongoing use, patient will establish controlled substance agreement with our office and complete urine drug screen.      Relevant Medications   LORazepam  (ATIVAN ) 0.5 MG tablet     Meds ordered this encounter  Medications   LORazepam  (ATIVAN ) 0.5 MG tablet    Sig: Take 0.5-1 tablets (0.25-0.5 mg total) by mouth 2 (two) times daily as needed for anxiety (sedation precautions).    Dispense:  15 tablet    Refill:  0    No orders of the defined types were placed in this encounter.   Patient Instructions  I will work on work accomodation forms and fax in when complete.  We will let you know when we send them in.  Continue wellbutrin  SR 100mg  daily  May try lorazepam  1/2 tablet at a time as needed for anxiety.   Follow up plan: Return if symptoms worsen or fail  to improve.  Anton Blas, MD

## 2023-11-11 NOTE — Assessment & Plan Note (Signed)
 Significant anxiety stemming from stressful situation at work 2019 followed by COVID limitations.  Has been working from home which has been very effective for patient.  Now with looming return to work discussions notes limited anxiety attacks developing.  He has spoken with direct managers about work accomodation request and they appear on board.  He requests work accomodation forms filled out today which I will do.  Also reviewed  PRN anxiety medication for his symptoms. He feels hydroxyzine may be too sedating (given his response to benadryl) so will try limited use of PRN lorazepam .  Discussed pros and cons of controlled substances and expectations to receive prescription from our office. Discussed recommended short term use of med. Discussed risks of medication including dependence, tolerance, and addiction/abuse potential. If ongoing use, patient will establish controlled substance agreement with our office and complete urine drug screen.

## 2023-11-11 NOTE — Assessment & Plan Note (Signed)
 Chronic, overall stable period on Wellbutrin  SR 100mg  daily non-stimulant regimen.  Straterra lost effect.

## 2023-11-13 ENCOUNTER — Telehealth: Payer: Self-pay

## 2023-11-13 NOTE — Telephone Encounter (Signed)
 Received completed Accommodation Medical Assessment forms from provider.  Faxed to [facility] at [phone]  Copy sent to scan  Left Mychart message letting patient know a copy for his records are at the front desk to pick up at his convenience.

## 2024-02-16 ENCOUNTER — Ambulatory Visit (INDEPENDENT_AMBULATORY_CARE_PROVIDER_SITE_OTHER): Admitting: Family Medicine

## 2024-02-16 ENCOUNTER — Encounter: Payer: Self-pay | Admitting: Family Medicine

## 2024-02-16 VITALS — BP 118/78 | HR 83 | Temp 97.8°F | Ht 67.5 in | Wt 216.8 lb

## 2024-02-16 DIAGNOSIS — E785 Hyperlipidemia, unspecified: Secondary | ICD-10-CM

## 2024-02-16 DIAGNOSIS — Z0000 Encounter for general adult medical examination without abnormal findings: Secondary | ICD-10-CM

## 2024-02-16 DIAGNOSIS — J302 Other seasonal allergic rhinitis: Secondary | ICD-10-CM | POA: Diagnosis not present

## 2024-02-16 DIAGNOSIS — E66811 Obesity, class 1: Secondary | ICD-10-CM

## 2024-02-16 DIAGNOSIS — F900 Attention-deficit hyperactivity disorder, predominantly inattentive type: Secondary | ICD-10-CM | POA: Diagnosis not present

## 2024-02-16 DIAGNOSIS — Z1211 Encounter for screening for malignant neoplasm of colon: Secondary | ICD-10-CM

## 2024-02-16 DIAGNOSIS — F418 Other specified anxiety disorders: Secondary | ICD-10-CM

## 2024-02-16 DIAGNOSIS — R7303 Prediabetes: Secondary | ICD-10-CM

## 2024-02-16 MED ORDER — BUPROPION HCL ER (SR) 100 MG PO TB12: 100.0000 mg | ORAL_TABLET | Freq: Every day | ORAL | 3 refills | Status: AC

## 2024-02-16 MED ORDER — FLUTICASONE PROPIONATE 50 MCG/ACT NA SUSP: 2.0000 | Freq: Every day | NASAL | Status: AC | PRN

## 2024-02-16 NOTE — Progress Notes (Signed)
 Ph: [phone] Fax: [phone]   Patient ID: Gerald [name], male    DOB: April 16, 1980, 43 y.o.   MRN: [id number]  This visit was conducted in person.  BP 118/78 (Cuff Size: Normal)   Pulse 83   Temp 97.8 F (36.6 C) (Oral)   Ht 5' 7.5 (1.715 m)   Wt 216 lb 12.8 oz (98.3 kg)   SpO2 96%   BMI 33.45 kg/m    CC: CPE Subjective:   HPI: Gerald [name] is a 43 y.o. male presenting on 02/16/2024 for Annual Exam (No acute concerns//Might want tdap)   He is president of [employer]   ADHD - h/o childhood combined ADHD treated with stimulants. No childhood records available. Has been on Strattera  80mg  daily, stopped several months ago as it may have lost effect. Does not want to try Wellbutrin . Felt like a zombie when on Ritalin as a child.    Had C6/7 cervical neck surgery with disc replacement 05/2022 by [facility] Dr Dow in [place].   Preventative: Colon cancer screening - reviewed options, requests colonoscopy to [facility] Prostate cancer screen - no fmhx  Flu shot - declined COVID series - Pfizer 06/2019 x2, no booster  Td - 1995, 03/2010 , declines rpt at this time Seat belt use discussed.  Sunscreen use discussed. No changing moles.  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  Activity: enjoys golf but not recently, working in the yard  Diet: water, good fruits/vegetables, limits sweetened beverages      Relevant past medical, surgical, family and social history reviewed and updated as indicated. Interim medical history since our last visit reviewed. Allergies and medications reviewed and updated. Outpatient Medications Prior to Visit  Medication Sig Dispense Refill   loratadine (CLARITIN) 10 MG tablet Take 10 mg by mouth as needed.       LORazepam  (ATIVAN ) 0.5 MG tablet  Take 0.5-1 tablets (0.25-0.5 mg total) by mouth 2 (two) times daily as needed for anxiety (sedation precautions). 15 tablet 0   Multiple Vitamin (MULTIVITAMIN) tablet Take 1 tablet by mouth daily.     pseudoephedrine (SUDAFED) 30 MG tablet Take 30 mg by mouth every 4 (four) hours as needed for congestion.     buPROPion  ER (WELLBUTRIN  SR) 100 MG 12 hr tablet Take 1 tablet (100 mg total) by mouth daily. 90 tablet 2   fluticasone  (FLONASE ) 50 MCG/ACT nasal spray Place 2 sprays into both nostrils daily as needed for allergies or rhinitis. 16 g 6   No facility-administered medications prior to visit.     Per HPI unless specifically indicated in ROS section below Review of Systems  Constitutional:  Negative for activity change, appetite change, chills, fatigue, fever and unexpected weight change.  HENT:  Negative for hearing loss.   Eyes:  Negative for visual disturbance.  Respiratory:  Positive for cough (occ). Negative for chest tightness, shortness of breath and wheezing.   Cardiovascular:  Negative for chest pain, palpitations and leg swelling.  Gastrointestinal:  Negative for abdominal distention, abdominal pain, blood in stool, constipation, diarrhea, nausea and vomiting.  Genitourinary:  Negative for difficulty urinating and hematuria.  Musculoskeletal:  Negative for arthralgias, myalgias and neck pain.  Skin:  Negative for rash.  Neurological:  Negative for dizziness, seizures, syncope and headaches.  Hematological:  Negative for adenopathy. Does not bruise/bleed easily.  Psychiatric/Behavioral:  Negative for dysphoric mood. The patient is not nervous/anxious.     Objective:  BP 118/78 (Cuff Size: Normal)   Pulse 83   Temp 97.8 F (36.6 C) (Oral)   Ht 5' 7.5 (1.715 m)   Wt 216 lb 12.8 oz (98.3 kg)   SpO2 96%   BMI 33.45 kg/m   Wt Readings from Last 3 Encounters:  02/16/24 216 lb 12.8 oz (98.3 kg)  11/10/23 212 lb 4 oz (96.3 kg)  02/15/23 211 lb 6 oz (95.9 kg)      Physical  Exam Vitals and nursing note reviewed.  Constitutional:      General: He is not in acute distress.    Appearance: Normal appearance. He is well-developed. He is not ill-appearing.  HENT:     Head: Normocephalic and atraumatic.     Right Ear: Hearing, tympanic membrane, ear canal and external ear normal.     Left Ear: Hearing, tympanic membrane, ear canal and external ear normal.     Mouth/Throat:     Mouth: Mucous membranes are moist.     Pharynx: Oropharynx is clear. No oropharyngeal exudate or posterior oropharyngeal erythema.  Eyes:     General: No scleral icterus.    Extraocular Movements: Extraocular movements intact.     Conjunctiva/sclera: Conjunctivae normal.     Pupils: Pupils are equal, round, and reactive to light.  Neck:     Thyroid : No thyroid  mass or thyromegaly.  Cardiovascular:     Rate and Rhythm: Normal rate and regular rhythm.     Pulses: Normal pulses.          Radial pulses are 2+ on the right side and 2+ on the left side.     Heart sounds: Normal heart sounds. No murmur heard. Pulmonary:     Effort: Pulmonary effort is normal. No respiratory distress.     Breath sounds: Normal breath sounds. No wheezing, rhonchi or rales.  Abdominal:     General: Bowel sounds are normal. There is no distension.     Palpations: Abdomen is soft. There is no mass.     Tenderness: There is no abdominal tenderness. There is no guarding or rebound.     Hernia: No hernia is present.  Musculoskeletal:        General: Normal range of motion.     Cervical back: Normal range of motion and neck supple.     Right lower leg: No edema.     Left lower leg: No edema.  Lymphadenopathy:     Cervical: No cervical adenopathy.  Skin:    General: Skin is warm and dry.     Findings: No rash.  Neurological:     General: No focal deficit present.     Mental Status: He is alert and oriented to person, place, and time.  Psychiatric:        Mood and Affect: Mood normal.        Behavior:  Behavior normal.        Thought Content: Thought content normal.        Judgment: Judgment normal.       Results for orders placed or performed in visit on 02/09/23  Comprehensive metabolic panel   Collection Time: 02/09/23  9:29 AM  Result Value Ref Range   Glucose 103 (H) 70 - 99 mg/dL   BUN 12 6 - 24 mg/dL   Creatinine, Ser 8.94 0.76 - 1.27 mg/dL  eGFR 90 >59 mL/min/1.73   BUN/Creatinine Ratio 11 9 - 20   Sodium 143 134 - 144 mmol/L   Potassium 4.9 3.5 - 5.2 mmol/L   Chloride 103 96 - 106 mmol/L   CO2 24 20 - 29 mmol/L   Calcium 9.5 8.7 - 10.2 mg/dL   Total Protein 7.2 6.0 - 8.5 g/dL   Albumin 4.4 4.1 - 5.1 g/dL   Globulin, Total 2.8 1.5 - 4.5 g/dL   Bilirubin Total 1.1 0.0 - 1.2 mg/dL   Alkaline Phosphatase 89 44 - 121 IU/L   AST 23 0 - 40 IU/L   ALT 44 0 - 44 IU/L  Hemoglobin A1c   Collection Time: 02/09/23  9:29 AM  Result Value Ref Range   Hgb A1c MFr Bld 6.1 (H) 4.8 - 5.6 %   Est. average glucose Bld gHb Est-mCnc 128 mg/dL  Lipid panel   Collection Time: 02/09/23  9:29 AM  Result Value Ref Range   Cholesterol, Total 189 100 - 199 mg/dL   Triglycerides 872 0 - 149 mg/dL   HDL 43 >60 mg/dL   VLDL Cholesterol Cal 23 5 - 40 mg/dL   LDL Chol Calc (NIH) 876 (H) 0 - 99 mg/dL   Chol/HDL Ratio 4.4 0.0 - 5.0 ratio  TSH   Collection Time: 02/09/23  9:29 AM  Result Value Ref Range   TSH 3.480 0.450 - 4.500 uIU/mL  Hepatitis C antibody   Collection Time: 02/09/23  9:29 AM  Result Value Ref Range   Hep C Virus Ab Non Reactive Non Reactive    Assessment & Plan:   Problem List Items Addressed This Visit     Health maintenance examination - Primary (Chronic)   Preventative protocols reviewed and updated unless pt declined. Discussed healthy diet and lifestyle.       Dyslipidemia   Chronic, not on medcations - update FLP today. The 10-year ASCVD risk score (Arnett DK, et al., 2019) is: 1.9%   Values used to calculate the score:     Age: 23 years     Clinically  relevant sex: Male     Is Non-Hispanic African American: No     Diabetic: No     Tobacco smoker: No     Systolic Blood Pressure: 118 mmHg     Is BP treated: No     HDL Cholesterol: 43 mg/dL     Total Cholesterol: 189 mg/dL       Relevant Orders   Comprehensive metabolic panel with GFR   Lipid panel   Obesity, Class I, BMI 30-34.9   Continue to encourage healthy diet and lifestyle choices to affect sustainable weight loss.       Allergic rhinitis   Continue PRN OTC medications.       ADHD (attention deficit hyperactivity disorder), inattentive type   Chronic, overall stable period on wellbutrin  SR 100mg  daily - continue.       Relevant Medications   buPROPion  ER (WELLBUTRIN  SR) 100 MG 12 hr tablet   Prediabetes   Limit added sugar in diet. Update A1c.       Relevant Orders   Hemoglobin A1c   Situational anxiety   See above - doing well on current regimen of wellbutrin  SR 100mg  daily.  Continue sparing PRN lorazepam  use.       Relevant Medications   buPROPion  ER (WELLBUTRIN  SR) 100 MG 12 hr tablet   Other Visit Diagnoses       Special screening for malignant  neoplasms, colon       Relevant Orders   Ambulatory referral to Gastroenterology        Meds ordered this encounter  Medications   buPROPion  ER (WELLBUTRIN  SR) 100 MG 12 hr tablet    Sig: Take 1 tablet (100 mg total) by mouth daily.    Dispense:  90 tablet    Refill:  3   fluticasone  (FLONASE ) 50 MCG/ACT nasal spray    Sig: Place 2 sprays into both nostrils daily as needed for allergies or rhinitis.    Orders Placed This Encounter  Procedures   Comprehensive metabolic panel with GFR   Hemoglobin A1c   Lipid panel   Ambulatory referral to Gastroenterology    Referral Priority:   Routine    Referral Type:   Consultation    Referral Reason:   Specialty Services Required    Number of Visits Requested:   1    Patient Instructions  You are doing well today I will refer you for colonoscopy in  [place]. You may call [facility] to schedule an appointment at [phone] at your conveinence.   Labs today Return as needed or in 1 year for next physical  Follow up plan: Return in about 1 year (around 02/15/2025).  Anton Blas, MD

## 2024-02-16 NOTE — Assessment & Plan Note (Signed)
 Chronic, overall stable period on wellbutrin  SR 100mg  daily - continue.

## 2024-02-16 NOTE — Assessment & Plan Note (Signed)
 Continue PRN OTC medications

## 2024-02-16 NOTE — Assessment & Plan Note (Signed)
Limit added sugar in diet. Update A1c.

## 2024-02-16 NOTE — Patient Instructions (Addendum)
 You are doing well today I will refer you for colonoscopy in [place]. You may call [facility] to schedule an appointment at [phone] at your conveinence.   Labs today Return as needed or in 1 year for next physical

## 2024-02-16 NOTE — Assessment & Plan Note (Signed)
 Continue to encourage healthy diet and lifestyle choices to affect sustainable weight loss.

## 2024-02-16 NOTE — Assessment & Plan Note (Signed)
 See above - doing well on current regimen of wellbutrin  SR 100mg  daily.  Continue sparing PRN lorazepam  use.

## 2024-02-16 NOTE — Assessment & Plan Note (Signed)
 Preventative protocols reviewed and updated unless pt declined. Discussed healthy diet and lifestyle.

## 2024-02-16 NOTE — Assessment & Plan Note (Addendum)
 Chronic, not on medcations - update FLP today. The 10-year ASCVD risk score (Arnett DK, et al., 2019) is: 1.9%   Values used to calculate the score:     Age: 43 years     Clinically relevant sex: Male     Is Non-Hispanic African American: No     Diabetic: No     Tobacco smoker: No     Systolic Blood Pressure: 118 mmHg     Is BP treated: No     HDL Cholesterol: 43 mg/dL     Total Cholesterol: 189 mg/dL

## 2024-02-17 ENCOUNTER — Encounter: Payer: Self-pay | Admitting: Internal Medicine

## 2024-02-17 LAB — COMPREHENSIVE METABOLIC PANEL WITH GFR
ALT: 27 IU/L (ref 0–44)
AST: 15 IU/L (ref 0–40)
Albumin: 4.4 g/dL (ref 4.1–5.1)
Alkaline Phosphatase: 88 IU/L (ref 47–123)
BUN/Creatinine Ratio: 16 (ref 9–20)
BUN: 16 mg/dL (ref 6–24)
Bilirubin Total: 0.8 mg/dL (ref 0.0–1.2)
CO2: 24 mmol/L (ref 20–29)
Calcium: 9.6 mg/dL (ref 8.7–10.2)
Chloride: 101 mmol/L (ref 96–106)
Creatinine, Ser: 0.99 mg/dL (ref 0.76–1.27)
Globulin, Total: 2.9 g/dL (ref 1.5–4.5)
Glucose: 81 mg/dL (ref 70–99)
Potassium: 4.4 mmol/L (ref 3.5–5.2)
Sodium: 139 mmol/L (ref 134–144)
Total Protein: 7.3 g/dL (ref 6.0–8.5)
eGFR: 96 mL/min/1.73 (ref >59)

## 2024-02-17 LAB — LIPID PANEL
Chol/HDL Ratio: 5 ratio (ref 0.0–5.0)
Cholesterol, Total: 191 mg/dL (ref 100–199)
HDL: 38 mg/dL — ABNORMAL LOW (ref >39)
LDL Chol Calc (NIH): 120 mg/dL — ABNORMAL HIGH (ref 0–99)
Triglycerides: 189 mg/dL — ABNORMAL HIGH (ref 0–149)
VLDL Cholesterol Cal: 33 mg/dL (ref 5–40)

## 2024-02-17 LAB — HEMOGLOBIN A1C
Est. average glucose Bld gHb Est-mCnc: 117 mg/dL
Hgb A1c MFr Bld: 5.7 % — ABNORMAL HIGH (ref 4.8–5.6)

## 2024-02-19 ENCOUNTER — Ambulatory Visit: Payer: Self-pay | Admitting: Family Medicine

## 2024-03-11 ENCOUNTER — Ambulatory Visit

## 2024-03-11 VITALS — Ht 67.5 in | Wt 215.0 lb

## 2024-03-11 DIAGNOSIS — Z1211 Encounter for screening for malignant neoplasm of colon: Secondary | ICD-10-CM

## 2024-03-11 MED ORDER — NA SULFATE-K SULFATE-MG SULF 17.5-3.13-1.6 GM/177ML PO SOLN: 1.0000 | Freq: Once | ORAL | 0 refills | Status: AC

## 2024-03-11 NOTE — Progress Notes (Signed)

## 2024-03-27 ENCOUNTER — Encounter: Payer: Self-pay | Admitting: Internal Medicine

## 2024-04-05 ENCOUNTER — Ambulatory Visit: Admitting: Internal Medicine

## 2024-04-05 ENCOUNTER — Encounter: Payer: Self-pay | Admitting: Internal Medicine

## 2024-04-05 VITALS — BP 119/91 | HR 87 | Temp 97.7°F | Resp 8 | Ht 67.5 in | Wt 215.0 lb

## 2024-04-05 DIAGNOSIS — D122 Benign neoplasm of ascending colon: Secondary | ICD-10-CM | POA: Diagnosis not present

## 2024-04-05 DIAGNOSIS — K648 Other hemorrhoids: Secondary | ICD-10-CM

## 2024-04-05 DIAGNOSIS — K635 Polyp of colon: Secondary | ICD-10-CM

## 2024-04-05 DIAGNOSIS — Z1211 Encounter for screening for malignant neoplasm of colon: Secondary | ICD-10-CM

## 2024-04-05 DIAGNOSIS — D123 Benign neoplasm of transverse colon: Secondary | ICD-10-CM

## 2024-04-05 MED ORDER — SODIUM CHLORIDE 0.9 % IV SOLN: 500.0000 mL | Freq: Once | INTRAVENOUS | Status: DC

## 2024-04-05 NOTE — Progress Notes (Signed)
 Sedate, gd SR, tolerated procedure well, VSS, report to RN

## 2024-04-05 NOTE — Progress Notes (Signed)
 Called to room to assist during endoscopic procedure.  Patient ID and intended procedure confirmed with present staff. Received instructions for my participation in the procedure from the performing physician.

## 2024-04-05 NOTE — Progress Notes (Signed)
 Pt's states no medical or surgical changes since previsit or office visit.

## 2024-04-05 NOTE — Patient Instructions (Signed)
 Educational handout provided to patient related to Hemorrhoids and Polyps  Resume previous diet  Continue present medications  Awaiting pathology results   YOU HAD AN ENDOSCOPIC PROCEDURE TODAY AT THE [facility]:   Refer to the procedure report that was given to you for any specific questions about what was found during the examination.  If the procedure report does not answer your questions, please call your gastroenterologist to clarify.  If you requested that your care partner not be given the details of your procedure findings, then the procedure report has been included in a sealed envelope for you to review at your convenience later.  YOU SHOULD EXPECT: Some feelings of bloating in the abdomen. Passage of more gas than usual.  Walking can help get rid of the air that was put into your GI tract during the procedure and reduce the bloating. If you had a lower endoscopy (such as a colonoscopy or flexible sigmoidoscopy) you may notice spotting of blood in your stool or on the toilet paper. If you underwent a bowel prep for your procedure, you may not have a normal bowel movement for a few days.  Please Note:  You might notice some irritation and congestion in your nose or some drainage.  This is from the oxygen used during your procedure.  There is no need for concern and it should clear up in a day or so.  SYMPTOMS TO REPORT IMMEDIATELY:  Following lower endoscopy (colonoscopy or flexible sigmoidoscopy):  Excessive amounts of blood in the stool  Significant tenderness or worsening of abdominal pains  Swelling of the abdomen that is new, acute  Fever of 100F or higher  For urgent or emergent issues, a gastroenterologist can be reached at any hour by calling [phone]. Do not use MyChart messaging for urgent concerns.    DIET:  We do recommend a small meal at first, but then you may proceed to your regular diet.  Drink plenty of fluids but you should avoid alcoholic  beverages for 24 hours.  ACTIVITY:  You should plan to take it easy for the rest of today and you should NOT DRIVE or use heavy machinery until tomorrow (because of the sedation medicines used during the test).    FOLLOW UP: Our staff will call the number listed on your records the next business day following your procedure.  We will call around 7:15- 8:00 am to check on you and address any questions or concerns that you may have regarding the information given to you following your procedure. If we do not reach you, we will leave a message.     If any biopsies were taken you will be contacted by phone or by letter within the next 1-3 weeks.  Please call us at [phone] if you have not heard about the biopsies in 3 weeks.    SIGNATURES/CONFIDENTIALITY: You and/or your care partner have signed paperwork which will be entered into your electronic medical record.  These signatures attest to the fact that that the information above on your After Visit Summary has been reviewed and is understood.  Full responsibility of the confidentiality of this discharge information lies with you and/or your care-partner.

## 2024-04-09 ENCOUNTER — Telehealth: Payer: Self-pay | Admitting: Lactation Services

## 2024-04-10 ENCOUNTER — Ambulatory Visit: Payer: Self-pay | Admitting: Internal Medicine

## 2024-04-10 LAB — SURGICAL PATHOLOGY

## 2024-04-12 ENCOUNTER — Encounter: Payer: Self-pay | Admitting: Family Medicine
